# Patient Record
Sex: Male | Born: 1960 | Race: White | Hispanic: No | Smoking: Current every day smoker
Health system: Southern US, Community
[De-identification: ages and names within clinical notes are randomized; demographics above are authoritative.]

## PROBLEM LIST (undated history)

## (undated) DIAGNOSIS — I10 Essential (primary) hypertension: Secondary | ICD-10-CM

## (undated) DIAGNOSIS — F32A Depression, unspecified: Secondary | ICD-10-CM

## (undated) DIAGNOSIS — F329 Major depressive disorder, single episode, unspecified: Secondary | ICD-10-CM

## (undated) HISTORY — PX: APPENDECTOMY: SHX54

## (undated) HISTORY — PX: PATELLA FRACTURE SURGERY: SHX735

## (undated) HISTORY — PX: BACK SURGERY: SHX140

---

## 1997-10-31 ENCOUNTER — Ambulatory Visit (HOSPITAL_COMMUNITY): Admission: RE | Admit: 1997-10-31 | Discharge: 1997-11-01 | Payer: Self-pay | Admitting: Neurosurgery

## 2001-03-25 ENCOUNTER — Inpatient Hospital Stay (HOSPITAL_COMMUNITY): Admission: RE | Admit: 2001-03-25 | Discharge: 2001-03-27 | Payer: Self-pay | Admitting: Family Medicine

## 2001-03-25 ENCOUNTER — Encounter: Payer: Self-pay | Admitting: Family Medicine

## 2003-02-22 ENCOUNTER — Encounter: Payer: Self-pay | Admitting: Family Medicine

## 2003-02-22 ENCOUNTER — Ambulatory Visit (HOSPITAL_COMMUNITY): Admission: RE | Admit: 2003-02-22 | Discharge: 2003-02-22 | Payer: Self-pay | Admitting: Family Medicine

## 2004-09-26 ENCOUNTER — Ambulatory Visit (HOSPITAL_COMMUNITY): Admission: RE | Admit: 2004-09-26 | Discharge: 2004-09-26 | Payer: Self-pay | Admitting: General Surgery

## 2006-05-19 ENCOUNTER — Inpatient Hospital Stay (HOSPITAL_COMMUNITY): Admission: EM | Admit: 2006-05-19 | Discharge: 2006-05-23 | Payer: Self-pay | Admitting: Emergency Medicine

## 2006-07-27 ENCOUNTER — Ambulatory Visit (HOSPITAL_COMMUNITY): Admission: RE | Admit: 2006-07-27 | Discharge: 2006-07-27 | Payer: Self-pay | Admitting: Orthopaedic Surgery

## 2006-11-19 ENCOUNTER — Emergency Department: Payer: Self-pay | Admitting: Emergency Medicine

## 2007-02-15 ENCOUNTER — Ambulatory Visit (HOSPITAL_COMMUNITY): Admission: RE | Admit: 2007-02-15 | Discharge: 2007-02-15 | Payer: Self-pay | Admitting: Orthopaedic Surgery

## 2009-08-26 ENCOUNTER — Emergency Department (HOSPITAL_COMMUNITY): Admission: EM | Admit: 2009-08-26 | Discharge: 2009-08-26 | Payer: Self-pay | Admitting: Emergency Medicine

## 2009-08-29 ENCOUNTER — Ambulatory Visit (HOSPITAL_COMMUNITY): Admission: RE | Admit: 2009-08-29 | Discharge: 2009-08-29 | Payer: Self-pay | Admitting: Family Medicine

## 2010-07-29 LAB — BUN: BUN: 6 mg/dL (ref 6–23)

## 2010-07-29 LAB — CREATININE, SERUM: GFR calc non Af Amer: >60 mL/min (ref >60)

## 2010-09-23 NOTE — Op Note (Signed)
NAMEPURL, CLAYTOR               ACCOUNT NO.:  [id number]   MEDICAL RECORD NO.:  [id number]          PATIENT TYPE:  AMB   LOCATION:  DAY                           FACILITY:  [facility]   PHYSICIAN:  J. Darreld Mclean, M.D. DATE OF BIRTH:  30-Jul-1960   DATE OF PROCEDURE:  DATE OF DISCHARGE:                               OPERATIVE REPORT   PREOPERATIVE DIAGNOSIS:  Status post fracture, right patella.  Now for  hardware removal.   POSTOPERATIVE DIAGNOSIS:  Status post fracture, right patella.  Now for  hardware removal.   PROCEDURE:  Removal of two Kirschner wires and a figure-of-eight wire  from the right knee, status post patellar fracture.   ANESTHESIA:  General.   TOURNIQUET TIME:  29 minutes.   DRAINS:  None.   SURGEON:  J. Darreld Mclean, M.D.   The wires were given to the patient.   Patient had a fractured right patella in January of this year.  The  fracture is now healed.  One of the wires is irritating his knee.  He  would like to have it removed.  The wires are not broken.  Risks and  imponderables of the procedure have been discussed with the patient.  He  appears to understand and agree with the procedure.  In the previous  surgery, he understands about having degenerative disease around the  knee and that still holds.   DESCRIPTION OF PROCEDURE:  Patient was seen in the holding area.  The  right knee was marked by the patient.  I marked the right knee as the  correct surgical site.  He was brought to the operating room.  He was  placed supine on the operating room table and given general anesthesia.  The tourniquet was placed and deflated at the right upper thigh.  He was  prepped and draped in the usual manner.  We had a time out and  identified Mr. Deisher as the patient and identified the right knee as the  correct surgical site.  The leg was elevated and wrapped  circumferentially with an esmarch bandage.  The tourniquet inflated to  300 mmHg.  The esmarch bandage  removed.  A small incision was made  distally at the distal portion of the wound and with general dissection  and palpation, I found the wires distally.  The Kirschner wires were  identified and turned.  The figure-of-eight wire was found, and the ends  of that were cut to expose the wire.  The most lateral wire was  identified first, then I went proximally and made a stab wound there  over the previous incision.  I found the proximal portion of the  Kirschner wire the most lateral.  The inferior portion of the wire was  cut, then was brought out proximally.  Over the most lateral wire, it  was easier to cut it proximally and then pull it out distally.  A figure-  of-eight wire was then removed.   The wounds are irrigated.  The wounds are approximated using 2-0  chromic, 2-0 plain, and then skin staples.  The tourniquet  was deflated  for 29 minutes.  Sterile dressing applied.  Bulky dressing applied.  Patient was given a prescription for Vicodin ES for pain.  I will see  him in the office in a week to 10 days.  For any difficulties, contact  me through the office hospital beeper system, numbers have been  provided.           ______________________________  Shela Commons. Darreld Mclean, M.D.     JWK/MEDQ  D:  02/15/2007  T:  02/15/2007  Job:  [id number]

## 2010-09-23 NOTE — H&P (Signed)
Ricky Casey, Ricky Casey               ACCOUNT NO.:  [id number]   MEDICAL RECORD NO.:  [id number]           PATIENT TYPE:  AMB   LOCATION:  DAY                           FACILITY:  [facility]   PHYSICIAN:  J. Darreld Mclean, M.D. DATE OF BIRTH:  [birth year]   DATE OF ADMISSION:  DATE OF DISCHARGE:  LH                              HISTORY & PHYSICAL   CHIEF COMPLAINT:  I need those wires removed from my knee cap.   HISTORY:  The patient is a 50 year old male who fractured his right  patella in January earlier this year.  The fracture is now healed, but  one of the wires is irritating his knee.  He would like to go ahead and  have this removed now.  He has done well with his knee and has had good  motion.  Since I saw him he has had problems with his back, and has been  treated for back problems.  He is seeing Dr. Newell Coral concerning his  back.  He is going to physical therapy related to his back as well.   CURRENT MEDICATIONS:  He takes occasional Flexeril, occasional Advil.   ALLERGIES:  He has no allergies.   HISTORY:  1. History of hypertension.  2. He had back surgery and 1998.  3. Appendectomy in 2003.  4. My knee surgery in January of this year.   FAMILY HISTORY:  He smokes.  He does not use alcoholic beverages.  DR.  Regino Schultze is his family doctor.  Heart disease and diabetes runs in the  family.  The patient is divorced, lives in [place].  The patient's  vital signs are normal.  HEENT: Negative.  NECK:  Supple  LUNGS:  Clear to P&A.  HEART:  Regular rhythm without murmur heard.  ABDOMEN:  Soft and tender without masses.  EXTREMITIES:  A well-healed scar over the right knee, and there is  prominence of one of the Kirschner wires.  Range of motion is very good  slight crepitus.  Other extremities are negative.  CENTRAL NERVOUS SYSTEM:  Intact.  SKIN:  Intact.   IMPRESSION:  1. Status post fracture right patella with prominence of one of the      pins.  2. History of low back  pain.   PLAN:  Removal of orthopedic hardware right knee.  I discussed with the  patient the planned procedure risks, and imponderables.  She understands  and agrees to this procedure as outlined.                                            ______________________________  J. Darreld Mclean, M.D.     JWK/MEDQ  D:  02/14/2007  T:  02/14/2007  Job:  [id number]

## 2010-09-26 NOTE — Discharge Summary (Signed)
[facility]  Patient:    Ricky Casey, Ricky Casey Visit Number: [id number] MRN: [id number]          Service Type: MED Location: 3A A322 01 Attending Physician:  Barbaraann Barthel Dictated by:   Barbaraann Barthel, M.D. Admit Date:  03/25/2001 Discharge Date: 03/27/2001   CC:         Karleen Hampshire, M.D.   Discharge Summary  DIAGNOSES:  Acute appendicitis.  PROCEDURE:  On March 25, 2001 exploratory laparotomy with appendectomy.  HISTORY:  This is a 50 year old white male who came into the emergency room with signs and symptoms clinically suggestive of acute appendicitis.  He had a low grade temperature.  He had a normal white count; however, he had right lower quadrant pain with localized rebound and anorexia.  He had a CT scan which suggested acute appendicitis noted in the emergency room as well.  He was taken to surgery on March 25, 2001 after initiation of hydration and antibiotic therapy and he was confirmed to have an acute suppurative nonperforated appendicitis.  Postoperatively his course was unremarkable.  His wound remained clean.  He was voiding without dysuria.  He had no leg pain or shortness of breath and his diet and activity were advanced as tolerated.  He was discharged on the second postoperative day.  LABORATORIES:  Pathology revealed acute appendicitis.  The patient was admitted with an 8.8 white count with an H&H of 16.1 and 45.8 with a normal shift.  He was discharged with a white count 10.2, H&H 14.0 and 39.5.  His electrolytes remained within normal limits.  As stated, his CT scan revealed changes suggestive of acute appendicitis. Electrocardiogram was grossly within normal limits.  As the patients hospital course was completely unremarkable, he was discharged on the second postoperative day.  We will follow him perioperatively after which he is to return to Dr. Regino Schultze for any medical problems he may have in the  future.  DISCHARGE INSTRUCTIONS:  Being excused from work.  He is discharged on a full liquid diet.  He is permitted to shower.  He is permitted to walk up and down the stairs. He is told to refrain from doing any heavy lifting or long driving or sexual activity in the immediate postoperative period.  He is discharged on Cipro 500 mg one tablet p.o. b.i.d.  He is told to refrain from any aspirin products and told to clean his wound with alcohol t.i.d.  He is to take Darvocet-N 100 one tablet q.4h. p.r.n. for pain.  He is told to refrain from using Ex-Lax or any other harsh ______.  We will follow him perioperatively after which he is to return to Dr. Regino Schultze for any medical problems in the future. Dictated by:   Barbaraann Barthel, M.D. Attending Physician:  Barbaraann Barthel DD:  04/13/01 TD:  04/13/01 Job: [id number]
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
## 2010-09-26 NOTE — Consult Note (Signed)
[facility]  Patient:    Ricky Casey, Ricky Casey Visit Number: [id number] MRN: [id number]          Service Type: EMS Location: ED Attending Physician:  Kirk Ruths Dictated by:   Barbaraann Barthel, M.D. Proc. Date: 03/25/01 Admit Date:  03/25/2001   CC:         Karleen Hampshire, M.D.   Consultation Report  BRIEF HISTORY:  Surgery was asked to see this 50 year old white male with approximately a 36-hour history of lower abdominal pain with nausea and vomiting.  HISTORY OF PRESENT ILLNESS:  The patient states that yesterday he awoke, and he was feeling well, however, when he got work he developed some crampy abdominal pain and nausea and vomiting.  He tried to eat at work but he vomited immediately.  He came back home and tried to take some tea and he vomited this as well.  He treated himself with Pepto-Bismol and this did not help him.  He sought medical attention by Dr. Regino Schultze, who obtained a CT scan which showed findings consistent with acute appendicitis.  Surgery was consulted and responded immediately.  PHYSICAL EXAMINATION:  GENERAL:  A pleasant 50 year old white male in no acute distress but uncomfortable.  VITAL SIGNS:  He is 6 foot 2 and weighs 235 pounds.  His temperature is 99. His heart rate is 83 per minute. Respirations 20 per minute and blood pressure 121/83.  HEENT:  Normocephalic.  Eyes:  Extraocular movements are intact.  Pupils are round and react to light and accommodation.  There is no conjunctive pallor or scleral injection.  Sclerae is normal tincture.  NECK:  Supple and cylindrical without jugular venous distension, thyromegaly, or tracheal deviation.  There is no cervical adenopathy.  CHEST:  Clear both to anterior and posterior auscultation.  HEART:  Regular.  No murmurs appreciated.  ABDOMEN:  Bowel sounds are diminished.  The patient is tender in the right lower quadrant with localized rebound.  He has no  hernias.  RECTAL:  The prostate is smooth and stool is guaiac negative.  EXTREMITIES:  Within normal limits.  REVIEW OF SYSTEMS:  The patient had no other surgery other than in the year 2000 he underwent a lumbar disk surgery.  He takes no medications on a regular basis other than for his sinus problems and he is taking rhinocort and Entex for these.  He took some Pepto-Bismol and then an aspirin prior to coming to the emergency room.  ALLERGIES:  No known drug allergies.  SOCIAL HISTORY:  He has no history of alcohol abuse.  He smokes 1-1/2 packs of cigarettes per day.  GI SYSTEM:  Nausea and vomiting.  Right lower quadrant pain.  No history of diarrhea or bright red rectal bleeding or black tarry stools.  No past history of hepatitis or inflammatory bowel disease.  GU SYSTEM:  The apartment has no dysuria, no history of kidney stones and has no CV tenderness.  ENDOCRINE SYSTEM:  No history of diabetes or thyroid disease.  NEUROLOGIC SYSTEM:  Within normal limits.  MUSCULOSKELETAL:  The patient is status post lumbar disk surgery.  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  LABORATORY DATA:  The patient has a white count of 8.8 with an hemoglobulin and hematocrit of 16.1, and 45.8, with 71 neutrophils appreciated.  Metabolic 7 is grossly within normal limits.  His potassium is slightly diminished at 3.4.  CT scan read by Dr. Purcell Mouton, shows symptoms suggestive of acute  appendicitis and the appendix is retrocecal.  PLAN:  The patient will be admitted.  He will be n.p.o.  Hydration is begun. Antibiotics have been administered and we will plan for laparotomy and appendectomy.  The procedure was explained in detail with the patient including risks, not limited to but including bleeding, infection, and leaking.  Informed consent was obtained. Dictated by:   Barbaraann Barthel, M.D. Attending Physician:  Kirk Ruths DD:  03/25/01 TD:  03/25/01 Job:  [id number]
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
## 2010-09-26 NOTE — H&P (Signed)
Ricky Casey, Ricky Casey               ACCOUNT NO.:  [id number]   MEDICAL RECORD NO.:  [id number]          PATIENT TYPE:  AMB   LOCATION:                                FACILITY:  [facility]   PHYSICIAN:  Dalia Heading, M.D.  DATE OF BIRTH:  Feb 05, 1961   DATE OF ADMISSION:  09/26/2004  DATE OF DISCHARGE:  LH                                HISTORY & PHYSICAL   CHIEF COMPLAINT:  Pilonidal cyst, lower abdominal pain and constipation.   HISTORY OF PRESENT ILLNESS:  Patient is a 50 year old white male who is  referred for evaluation and treatment of recurrent infected pilonidal cysts  as well as intermittent lower abdominal pain with constipation.  He has had  incision and drainage of the cyst in the remote past.  It has recurred  several times since then.  He is currently finishing a course of  antibiotics.  No purulent drainage is noted.  He has had intermittent  abdominal cramping and constipation for many years.  No nausea or vomiting  have been noted.  There is no family history of colon carcinoma.  No melena  or hematochezia have been noted.  He has never had a colonoscopy.  MiraLax  has been only minimally helpful.  He has a bowel movement every 4 days.   PAST MEDICAL HISTORY:  As noted above.   PAST SURGICAL HISTORY:  Appendectomy, lower back surgery as described.   CURRENT MEDICATIONS:  Keflex 500 mg p.o. t.i.d.   ALLERGIES:  No known drug allergies.   REVIEW OF SYSTEMS:  Patient does smoke a pack of cigarettes a day.  Denies  any significant alcohol use.  Denies any other cardiopulmonary difficulties  or bleeding disorders.   PHYSICAL EXAMINATION:  On physical examination, patient is a well-developed,  well-nourished white male in no acute distress.  He is afebrile and vital  signs are stable.  Lungs clear to auscultation with equal breath sounds  bilaterally.  Heart examination reveals a regular rate and rhythm without  S3, S4, or murmurs.  The abdomen is soft, nontender,  nondistended.  No  hepatosplenomegaly or masses are noted.  Back examination reveals a  pilonidal cyst present with mild induration but no drainage.  Minimal  erythema is noted.  Rectal examination was deferred to the procedure.   IMPRESSION:  1.  Pilonidal cyst, recurrent.  2.  Lower abdominal pain and constipation.   PLAN:  The patient is scheduled for TCS and excision of a pilonidal cyst on  Sep 26, 2004.  The risks and benefits of the procedures including bleeding,  infection, perforation, and wound dehiscence were fully explained to the  patient, who gave informed consent.      MAJ/MEDQ  D:  09/11/2004  T:  09/11/2004  Job:  [id number]   cc:   [facility]  Fax: [phone]   Kirk Ruths, M.D.  [address]  [place]  [place] [address]  Fax: [phone]
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
## 2010-09-26 NOTE — Op Note (Signed)
Ricky Casey, Ricky Casey               ACCOUNT NO.:  [id number]   MEDICAL RECORD NO.:  [id number]          PATIENT TYPE:  AMB   LOCATION:  DAY                           FACILITY:  [facility]   PHYSICIAN:  Dalia Heading, M.D.  DATE OF BIRTH:  02/27/1961   DATE OF PROCEDURE:  09/26/2004  DATE OF DISCHARGE:                                 OPERATIVE REPORT   PREOPERATIVE DIAGNOSIS:  Hematochezia, recurrent pilonidal cyst.   POSTOPERATIVE DIAGNOSIS:  Hematochezia, recurrent pilonidal cyst.   PROCEDURE:  Excision of pilonidal cyst, colonoscopy.   SURGEON:  Dr. Franky Macho.   ANESTHESIA:  1.  General for the pilonidal cyst  2.  MAC for the colonoscopy.   INDICATIONS:  The patient is a 50 year old white male who presents with both  hematochezia and the recurrent infected pilonidal cyst. The risks and  benefits of both the procedures including bleeding, perforation, and  recurrence of the pilonidal cyst were fully explained to the patient, who  gave informed consent.   PROCEDURE NOTE:  The patient was placed left lateral decubitus position.  Confirmation of the procedure was done. A rectal examination was performed  which was negative for any mass lesions. The colonoscope was advanced to the  cecum without difficulty. Confirmation of placement to the cecum was done  using transabdominal palpation and landmarks. The bowel preparation was  adequate, but not good. The cecum was fully visualized and noted to within  normal limits. The ascending colon, transverse colon, descending colon,  sigmoid colon, and rectum were all within normal limits. No abnormal lesions  were noted. Small internal hemorrhoids were noted, but they were not  actively bleeding. All fluid and air were then evacuated from the colon and  rectum prior to removal of the colonoscope.   Next, the patient underwent excision of pilonidal cyst after general  anesthesia was administered. The coccyx area was prepped and draped  using  the usual sterile technique with Betadine. Surgical site confirmation was  performed.   Elliptical incision was made in the skin over the coccyx. This was taken  down to the coccyx. The pilonidal cyst was excised and sent to pathology for  further examination. Bleeding was controlled using Bovie electrocautery. The  subcutaneous layer was reapproximated using a 3-0 Vicryl interrupted suture.  The skin was closed using a 4-0 nylon suture. Sensorcaine 0.5% was instilled  into the surrounding wound. Betadine ointment and dry sterile dressing were  applied.   All tape and needle counts were correct at the end of the procedure. The  patient was awakened and transferred to PACU in stable condition.   COMPLICATIONS:  None.   SPECIMEN:  Pilonidal cyst.   BLOOD LOSS:  Minimal.      MAJ/MEDQ  D:  09/26/2004  T:  09/26/2004  Job:  [id number]   cc:   Kirk Ruths, M.D.  [address]  [place]  [place] [address]  Fax: [phone]

## 2010-09-26 NOTE — Op Note (Signed)
[facility]  Patient:    Ricky Casey, Ricky Casey Visit Number: [id number] MRN: [id number]          Service Type: MED Location: 3A A322 01 Attending Physician:  Rosalyn Charters Dictated by:   Barbaraann Barthel, M.D. Proc. Date: 03/25/01 Admit Date:  03/25/2001   CC:         Karleen Hampshire, M.D.   Operative Report  PREOPERATIVE DIAGNOSIS:  Acute appendicitis.  POSTOPERATIVE DIAGNOSIS:  Acute appendicitis.  OPERATION:  Exploratory laparotomy with appendectomy.  SURGEON:  Barbaraann Barthel, M.D.  NOTE:  This is a 50 year old white male who came into the emergency room with low-grade temperature, normal white count, and other signs and symptoms suggestive of acute appendicitis.  He also had a CT scan which suggested appendicitis.  He was taken to surgery after discussing the procedure with him in detail including the risks not limited to but including bleeding, infection, and leaking.  Informed consent was obtained.  GROSS OPERATIVE FINDINGS:  Those consistent with acute suppurative, nonperforated appendicitis.  The patient had a retrocecal appendix with the tip inflamed about the size of a thumb.  There was no perforation.  The rest of the right lower quadrant appeared to be normal.  There was normal terminal ileum and no other abnormalities.  TECHNIQUE:  The patient was placed in a supine position.  After the adequate administration of general anesthesia via endotracheal intubation, the entire abdomen was prepped with Betadine solution and draped in the usual manner. Foley catheter was aseptically inserted.  A small transverse incision was carried about the level of the iliac spine through skin, subcutaneous tissue, down to the fascia which was opened.  The rectus muscle was retracted medially and divided in part.  The peritoneum was grasped between two clamps, and the abdomen was then opened and explored with the above findings.  The appendix was then  delivered into the wound, and we ligated the mesoappendix with 2-0 Polysorb.  The appendix was then removed with the Endo GIA stapling device. After checking for hemostasis, the area was irrigated with normal saline solution.  There was a problem with the sponge count, and this delayed as we had placed a sponge in the abdomen which took [place] time to retrieve.  However, at the time of closure, all sponge, needle, and instrument counts were accounted for.  The perineum was closed with a running 0 Polysorb suture, and the fascia was closed with figure-of-eight 0 Polysorb sutures.  Subcutaneous tissue was irrigated with normal saline solution, and the skin was approximated with a stapling device.  Prior to closure, all sponge, needle, and instrument counts were found to be correct.  Estimated blood loss was minimal, approximately 50 cc.  No drains were placed.  The patient received 2000 cc of crystalloid intraoperatively, and there were no complications. Dictated by:   Barbaraann Barthel, M.D. Attending Physician:  Rosalyn Charters DD:  03/25/01 TD:  03/25/01 Job: [id number]
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
## 2011-02-19 LAB — COMPREHENSIVE METABOLIC PANEL
Alkaline Phosphatase: 58
BUN: 12
CO2: 26
Creatinine, Ser: 1.01
GFR calc non Af Amer: >60

## 2011-02-19 LAB — DIFFERENTIAL
Basophils Relative: 1
Eosinophils Absolute: 0.4
Lymphocytes Relative: 21
Neutro Abs: 5.9
Neutrophils Relative %: 69

## 2011-02-19 LAB — URINALYSIS, ROUTINE W REFLEX MICROSCOPIC
Bilirubin Urine: NEGATIVE
Glucose, UA: NEGATIVE
Hgb urine dipstick: NEGATIVE
Ketones, ur: NEGATIVE
Nitrite: NEGATIVE
Protein, ur: NEGATIVE
Urobilinogen, UA: 0.2
pH: 6

## 2011-02-19 LAB — POCT I-STAT 4, (NA,K, GLUC, HGB,HCT): Glucose, Bld: 94

## 2011-02-19 LAB — CBC
HCT: 41.5
Hemoglobin: 14.2

## 2015-05-02 ENCOUNTER — Emergency Department (HOSPITAL_COMMUNITY)
Admission: EM | Admit: 2015-05-02 | Discharge: 2015-05-02 | Disposition: A | Discharge status: Home / Self Care | Payer: Self-pay | Attending: Emergency Medicine | Admitting: Emergency Medicine

## 2015-05-02 ENCOUNTER — Emergency Department (HOSPITAL_COMMUNITY): Payer: Self-pay

## 2015-05-02 ENCOUNTER — Encounter (HOSPITAL_COMMUNITY): Payer: Self-pay | Admitting: Emergency Medicine

## 2015-05-02 DIAGNOSIS — F329 Major depressive disorder, single episode, unspecified: Secondary | ICD-10-CM | POA: Insufficient documentation

## 2015-05-02 DIAGNOSIS — Z79899 Other long term (current) drug therapy: Secondary | ICD-10-CM | POA: Insufficient documentation

## 2015-05-02 DIAGNOSIS — M62830 Muscle spasm of back: Secondary | ICD-10-CM | POA: Insufficient documentation

## 2015-05-02 DIAGNOSIS — F17210 Nicotine dependence, cigarettes, uncomplicated: Secondary | ICD-10-CM | POA: Insufficient documentation

## 2015-05-02 DIAGNOSIS — M545 Low back pain: Secondary | ICD-10-CM | POA: Insufficient documentation

## 2015-05-02 DIAGNOSIS — I10 Essential (primary) hypertension: Secondary | ICD-10-CM | POA: Insufficient documentation

## 2015-05-02 DIAGNOSIS — M549 Dorsalgia, unspecified: Secondary | ICD-10-CM

## 2015-05-02 HISTORY — DX: Major depressive disorder, single episode, unspecified: F32.9

## 2015-05-02 HISTORY — DX: Essential (primary) hypertension: I10

## 2015-05-02 HISTORY — DX: Depression, unspecified: F32.A

## 2015-05-02 LAB — URINALYSIS, ROUTINE W REFLEX MICROSCOPIC
BILIRUBIN URINE: NEGATIVE
GLUCOSE, UA: NEGATIVE mg/dL
Hgb urine dipstick: NEGATIVE
KETONES UR: NEGATIVE mg/dL
Leukocytes, UA: NEGATIVE
NITRITE: NEGATIVE
PH: 6.5 (ref 5.0–8.0)
Protein, ur: NEGATIVE mg/dL
SPECIFIC GRAVITY, URINE: 1.01 (ref 1.005–1.030)

## 2015-05-02 MED ORDER — DIAZEPAM 5 MG PO TABS
5.0000 mg | ORAL_TABLET | Freq: Once | ORAL | Status: AC
  Administered 2015-05-02: 5 mg via ORAL
  Filled 2015-05-02: qty 1

## 2015-05-02 MED ORDER — KETOROLAC TROMETHAMINE 60 MG/2ML IM SOLN
60.0000 mg | Freq: Once | INTRAMUSCULAR | Status: AC
Start: 2015-05-02 — End: 2015-05-02
  Administered 2015-05-02: 60 mg via INTRAMUSCULAR
  Filled 2015-05-02: qty 2

## 2015-05-02 MED ORDER — HYDROMORPHONE HCL 1 MG/ML IJ SOLN
1.0000 mg | Freq: Once | INTRAMUSCULAR | Status: AC
  Administered 2015-05-02: 1 mg via INTRAVENOUS
  Filled 2015-05-02: qty 1

## 2015-05-02 MED ORDER — HYDROMORPHONE HCL 1 MG/ML IJ SOLN
1.0000 mg | Freq: Once | INTRAMUSCULAR | Status: AC
  Administered 2015-05-02: 1 mg via INTRAMUSCULAR
  Filled 2015-05-02: qty 1

## 2015-05-02 MED ORDER — IBUPROFEN 800 MG PO TABS: 800.0000 mg | ORAL_TABLET | Freq: Three times a day (TID) | ORAL | Status: DC

## 2015-05-02 MED ORDER — DIAZEPAM 5 MG PO TABS: 5.0000 mg | ORAL_TABLET | Freq: Two times a day (BID) | ORAL | Status: DC | PRN

## 2015-05-02 NOTE — ED Notes (Signed)
Patient able to ambulate with assistance x 1.  

## 2015-05-02 NOTE — ED Notes (Addendum)
Pt c/o lower back pain since 1100 am. Pt brought in by EMS on scoop stretcher. Pt states he bent over to pick up something and had spasm to lower back. Pt laid himself in the floor of his residence. Denies fall. denies numbness/tingling. Nad noted. Pt states he took 15mg  cyclobenzaprine po and one and a half tablet of hydrocodone 10/325 po at home pta.

## 2015-05-02 NOTE — ED Notes (Signed)
Patient attempted to ambulate but was unable due to back spasms

## 2015-05-02 NOTE — Discharge Instructions (Signed)
Muscle Cramps and Spasms Follow up with your doctor. Return to the ED if you develop new or worsening symptoms. Muscle cramps and spasms occur when a muscle or muscles tighten and you have no control over this tightening (involuntary muscle contraction). They are a common problem and can develop in any muscle. The most common place is in the calf muscles of the leg. Both muscle cramps and muscle spasms are involuntary muscle contractions, but they also have differences:   Muscle cramps are sporadic and painful. They may last a few seconds to a quarter of an hour. Muscle cramps are often more forceful and last longer than muscle spasms.  Muscle spasms may or may not be painful. They may also last just a few seconds or much longer. CAUSES  It is uncommon for cramps or spasms to be due to a serious underlying problem. In many cases, the cause of cramps or spasms is unknown. Some common causes are:   Overexertion.   Overuse from repetitive motions (doing the same thing over and over).   Remaining in a certain position for a long period of time.   Improper preparation, form, or technique while performing a sport or activity.   Dehydration.   Injury.   Side effects of some medicines.   Abnormally low levels of the salts and ions in your blood (electrolytes), especially potassium and calcium. This could happen if you are taking water pills (diuretics) or you are pregnant.  Some underlying medical problems can make it more likely to develop cramps or spasms. These include, but are not limited to:   Diabetes.   Parkinson disease.   Hormone disorders, such as thyroid problems.   Alcohol abuse.   Diseases specific to muscles, joints, and bones.   Blood vessel disease where not enough blood is getting to the muscles.  HOME CARE INSTRUCTIONS   Stay well hydrated. Drink enough water and fluids to keep your urine clear or pale yellow.  It may be helpful to massage, stretch,  and relax the affected muscle.  For tight or tense muscles, use a warm towel, heating pad, or hot shower water directed to the affected area.  If you are sore or have pain after a cramp or spasm, applying ice to the affected area may relieve discomfort.  Put ice in a plastic bag.  Place a towel between your skin and the bag.  Leave the ice on for 15-20 minutes, 03-04 times a day.  Medicines used to treat a known cause of cramps or spasms may help reduce their frequency or severity. Only take over-the-counter or prescription medicines as directed by your caregiver. SEEK MEDICAL CARE IF:  Your cramps or spasms get more severe, more frequent, or do not improve over time.  MAKE SURE YOU:   Understand these instructions.  Will watch your condition.  Will get help right away if you are not doing well or get worse.   This information is not intended to replace advice given to you by your health care provider. Make sure you discuss any questions you have with your health care provider.   Document Released: 10/17/2001 Document Revised: 08/22/2012 Document Reviewed: 04/13/2012 Elsevier Interactive Patient Education [employer]2016 [employer]vier Inc.

## 2015-05-02 NOTE — ED Notes (Signed)
Pt has hx of back pain, this is worse and causing spasms.

## 2015-05-02 NOTE — ED Provider Notes (Signed)
CSN: [id number]     Arrival date & time 05/02/15  1653 History   First MD Initiated Contact with Patient 05/02/15 1735     Chief Complaint  Patient presents with  . Back Pain     (Consider location/radiation/quality/duration/timing/severity/associated sxs/prior Treatment) HPI Comments: Patient with low back pain and spasm that onset around 11 AM. He states he bent over to pick up something and had severe spasm in his back. He laid himself on the floor for while and then was able to walk back to his house where he laid down again. Pain became worse. Denies fall. Take hydrocodone and Flexeril without relief. Patient with history of back pain in the same location but never this severe. Denies any focal weakness, numbness or tingling. No bowel or bladder incontinence. No fever or vomiting. Pain does not radiate and stays in the center of his back. No chest pain or shortness of breath. He had an MRI in August that showed multilevel disc degeneration.  The history is provided by the patient and the EMS personnel. The history is limited by the condition of the patient.    Past Medical History  Diagnosis Date  . Hypertension   . Depression    Past Surgical History  Procedure Laterality Date  . Back surgery    . Patella fracture surgery    . Appendectomy     No family history on file. Social History  Substance Use Topics  . Smoking status: Current Every Day Smoker -- 1.00 packs/day    Types: Cigarettes  . Smokeless tobacco: Not on file  . Alcohol Use: No    Review of Systems  Constitutional: Negative for fever, activity change and appetite change.  HENT: Negative for congestion and rhinorrhea.   Respiratory: Negative for cough, chest tightness and shortness of breath.   Cardiovascular: Negative for chest pain.  Gastrointestinal: Negative for nausea, vomiting and abdominal pain.  Genitourinary: Negative for dysuria and hematuria.  Musculoskeletal: Positive for back pain. Negative for  neck pain.  Skin: Negative for rash.  Neurological: Negative for dizziness, weakness and headaches.  A complete 10 system review of systems was obtained and all systems are negative except as noted in the HPI and PMH.      Allergies  Morphine and related  Home Medications   Prior to Admission medications   Medication Sig Start Date End Date Taking? Authorizing Provider  atorvastatin (LIPITOR) 40 MG tablet Take 40 mg by mouth at bedtime.   Yes Historical Provider, MD  buPROPion (WELLBUTRIN SR) 100 MG 12 hr tablet Take 100 mg by mouth daily.   Yes Historical Provider, MD  cyclobenzaprine (FLEXERIL) 10 MG tablet Take 10 mg by mouth at bedtime as needed for muscle spasms.   Yes Historical Provider, MD  HYDROcodone-acetaminophen (NORCO) 10-325 MG tablet Take 1 tablet by mouth every 12 (twelve) hours as needed for moderate pain.   Yes Historical Provider, MD  lisinopril (PRINIVIL,ZESTRIL) 10 MG tablet Take 10 mg by mouth daily.   Yes Historical Provider, MD  sertraline (ZOLOFT) 100 MG tablet Take 100 mg by mouth at bedtime.   Yes Historical Provider, MD  diazepam (VALIUM) 5 MG tablet Take 1 tablet (5 mg total) by mouth every 12 (twelve) hours as needed for muscle spasms. 05/02/15   Glynn Octave, MD  ibuprofen (ADVIL,MOTRIN) 800 MG tablet Take 1 tablet (800 mg total) by mouth 3 (three) times daily. 05/02/15   Glynn Octave, MD   BP 132/93 mmHg  Pulse 88  Temp(Src) 98.3 F (36.8 C)  Resp 18  Ht 6\' 2"  (1.88 m)  Wt 240 lb (108.863 kg)  BMI 30.80 kg/m2  SpO2 94% Physical Exam  Constitutional: He is oriented to person, place, and time. He appears well-developed and well-nourished. He appears distressed.  uncomfortable  HENT:  Head: Normocephalic and atraumatic.  Mouth/Throat: Oropharynx is clear and moist. No oropharyngeal exudate.  Eyes: Conjunctivae and EOM are normal. Pupils are equal, round, and reactive to light.  Neck: Normal range of motion. Neck supple.  No meningismus.   Cardiovascular: Normal rate, regular rhythm, normal heart sounds and intact distal pulses.   No murmur heard. Pulmonary/Chest: Effort normal and breath sounds normal. No respiratory distress. He exhibits no tenderness.  Abdominal: Soft. There is no tenderness. There is no rebound and no guarding.  Musculoskeletal: Normal range of motion. He exhibits tenderness. He exhibits no edema.  Tenderness to palpation in the midline lumbar spine. No step-off.  5/5 strength in bilateral lower extremities. Ankle plantar and dorsiflexion intact. Great toe extension intact bilaterally. +2 DP and PT pulses. +2 patellar reflexes bilaterally.    Neurological: He is alert and oriented to person, place, and time. No cranial nerve deficit. He exhibits normal muscle tone. Coordination normal.  No ataxia on finger to nose bilaterally. No pronator drift. 5/5 strength throughout. CN 2-12 intact.Equal grip strength. Sensation intact.   Skin: Skin is warm.  Psychiatric: He has a normal mood and affect. His behavior is normal.  Nursing note and vitals reviewed.   ED Course  Procedures (including critical care time) Labs Review Labs Reviewed  URINALYSIS, ROUTINE W REFLEX MICROSCOPIC (NOT AT [facility])    Imaging Review Ct Renal Stone Study  05/02/2015  CLINICAL DATA:  54 year old male with lower back pain. Concern for kidney stone. EXAM: CT ABDOMEN AND PELVIS WITHOUT CONTRAST TECHNIQUE: Multidetector CT imaging of the abdomen and pelvis was performed following the standard protocol without IV contrast. COMPARISON:  CT dated 11/19/2006 and lumbar MRI dated 04/12/2012 FINDINGS: Evaluation of this exam is limited in the absence of intravenous contrast. The visualized lung bases are clear. No intra-abdominal free air or free fluid. The liver, gallbladder, pancreas, spleen, adrenal glands, kidneys, visualized ureters, and urinary bladder appear unremarkable. The prostate and seminal vesicles are grossly unremarkable. There is  moderate stool throughout the colon. There are scattered colonic diverticula. No evidence of bowel obstruction or inflammation. Appendectomy. The abdominal aorta and IVC appear unremarkable on this noncontrast study. No portal venous gas identified. There is no adenopathy. There is a small fat containing umbilical hernia the abdominal wall soft tissues otherwise appear unremarkable. Old right posterior rib fracture deformity noted. There is no acute fracture. IMPRESSION: No hydronephrosis or nephrolithiasis. No evidence of bowel obstruction or inflammation.  Normal appendix. Electronically Signed   By: Elgie CollardArash  Radparvar M.D.   On: 05/02/2015 19:04   I have personally reviewed and evaluated these images and lab results as part of my medical decision-making.   EKG Interpretation None      MDM   Final diagnoses:  Back pain  Back spasm   lumbar back pain and spasm. No radiation of pain. No incontinence. Intact strength and sensation on exam. Intact patellar reflexes.  Recent MRI in August 2016 reviewed. IMPRESSION: Multilevel degenerative changes most pronounced at the L4-L5 vertebral level for which there is moderate spinal canal stenosis with moderate bilateral neural foraminal narrowing.  Urinalysis negative.  Patient given pain medicine and muscle relaxers in the ED. His pain  is improved. There is no focal weakness, numbness or tingling or incontinence. No evidence of cord compression or cauda equina.  Patient is ambulatory. He takes hydrocodone chronically. We'll add some muscle relaxer and antiinflammatory to his regimen. Follow-up with PCP this week. Return precautions discussed.  Glynn Octave, MD 05/03/15 0157

## 2015-05-02 NOTE — ED Notes (Signed)
Ambulated patient from bed to outside of room and back to bed with no difficulty. Patient stated he was still in pain. Made nurse aware.

## 2021-09-13 ENCOUNTER — Emergency Department (HOSPITAL_COMMUNITY): Payer: Self-pay

## 2021-09-13 ENCOUNTER — Emergency Department (HOSPITAL_COMMUNITY)
Admission: EM | Admit: 2021-09-13 | Discharge: 2021-09-14 | Disposition: A | Discharge status: Home / Self Care | Payer: Self-pay | Attending: Emergency Medicine | Admitting: Emergency Medicine

## 2021-09-13 ENCOUNTER — Encounter (HOSPITAL_COMMUNITY): Payer: Self-pay

## 2021-09-13 ENCOUNTER — Other Ambulatory Visit: Payer: Self-pay

## 2021-09-13 DIAGNOSIS — W11XXXA Fall on and from ladder, initial encounter: Secondary | ICD-10-CM | POA: Insufficient documentation

## 2021-09-13 DIAGNOSIS — S270XXA Traumatic pneumothorax, initial encounter: Secondary | ICD-10-CM

## 2021-09-13 DIAGNOSIS — S2241XA Multiple fractures of ribs, right side, initial encounter for closed fracture: Secondary | ICD-10-CM

## 2021-09-13 DIAGNOSIS — S5001XA Contusion of right elbow, initial encounter: Secondary | ICD-10-CM

## 2021-09-13 DIAGNOSIS — S41111A Laceration without foreign body of right upper arm, initial encounter: Secondary | ICD-10-CM

## 2021-09-13 DIAGNOSIS — R519 Headache, unspecified: Secondary | ICD-10-CM | POA: Insufficient documentation

## 2021-09-13 DIAGNOSIS — Z23 Encounter for immunization: Secondary | ICD-10-CM | POA: Insufficient documentation

## 2021-09-13 DIAGNOSIS — S12691A Other nondisplaced fracture of seventh cervical vertebra, initial encounter for closed fracture: Secondary | ICD-10-CM

## 2021-09-13 LAB — CBC WITH DIFFERENTIAL/PLATELET
Abs Immature Granulocytes: 0.18 10*3/uL — ABNORMAL HIGH (ref 0.00–0.07)
Basophils Absolute: 0.1 10*3/uL (ref 0.0–0.1)
Basophils Relative: 1 %
Eosinophils Absolute: 0.2 10*3/uL (ref 0.0–0.5)
Eosinophils Relative: 1 %
HCT: 44.5 % (ref 39.0–52.0)
Hemoglobin: 14.6 g/dL (ref 13.0–17.0)
Immature Granulocytes: 1 %
Lymphocytes Relative: 12 %
Lymphs Abs: 1.8 10*3/uL (ref 0.7–4.0)
MCH: 32.4 pg (ref 26.0–34.0)
MCHC: 32.8 g/dL (ref 30.0–36.0)
MCV: 98.9 fL (ref 80.0–100.0)
Monocytes Absolute: 0.9 10*3/uL (ref 0.1–1.0)
Monocytes Relative: 6 %
Neutro Abs: 12.2 10*3/uL — ABNORMAL HIGH (ref 1.7–7.7)
Neutrophils Relative %: 79 %
Platelets: 263 10*3/uL (ref 150–400)
RBC: 4.5 MIL/uL (ref 4.22–5.81)
RDW: 13 % (ref 11.5–15.5)
WBC: 15.4 10*3/uL — ABNORMAL HIGH (ref 4.0–10.5)
nRBC: 0 % (ref 0.0–0.2)

## 2021-09-13 LAB — COMPREHENSIVE METABOLIC PANEL
ALT: 44 U/L (ref 0–44)
AST: 56 U/L — ABNORMAL HIGH (ref 15–41)
Albumin: 4.1 g/dL (ref 3.5–5.0)
Alkaline Phosphatase: 53 U/L (ref 38–126)
Anion gap: 7 (ref 5–15)
BUN: 16 mg/dL (ref 6–20)
CO2: 28 mmol/L (ref 22–32)
Calcium: 8.9 mg/dL (ref 8.9–10.3)
Chloride: 107 mmol/L (ref 98–111)
Creatinine, Ser: 1.11 mg/dL (ref 0.61–1.24)
GFR, Estimated: >60 mL/min (ref >60)
Glucose, Bld: 114 mg/dL — ABNORMAL HIGH (ref 70–99)
Potassium: 3.2 mmol/L — ABNORMAL LOW (ref 3.5–5.1)
Sodium: 142 mmol/L (ref 135–145)
Total Bilirubin: 0.5 mg/dL (ref 0.3–1.2)
Total Protein: 7.4 g/dL (ref 6.5–8.1)

## 2021-09-13 MED ORDER — TETANUS-DIPHTH-ACELL PERTUSSIS 5-2.5-18.5 LF-MCG/0.5 IM SUSY
0.5000 mL | PREFILLED_SYRINGE | Freq: Once | INTRAMUSCULAR | Status: AC
  Administered 2021-09-13: 0.5 mL via INTRAMUSCULAR
  Filled 2021-09-13: qty 0.5

## 2021-09-13 MED ORDER — ONDANSETRON HCL 4 MG/2ML IJ SOLN
4.0000 mg | Freq: Once | INTRAMUSCULAR | Status: AC
  Administered 2021-09-13: 4 mg via INTRAVENOUS
  Filled 2021-09-13: qty 2

## 2021-09-13 MED ORDER — IOHEXOL 350 MG/ML SOLN
75.0000 mL | Freq: Once | INTRAVENOUS | Status: AC | PRN
  Administered 2021-09-13: 75 mL via INTRAVENOUS

## 2021-09-13 MED ORDER — HYDROMORPHONE HCL 1 MG/ML IJ SOLN
1.0000 mg | Freq: Once | INTRAMUSCULAR | Status: AC
  Administered 2021-09-13: 1 mg via INTRAVENOUS
  Filled 2021-09-13: qty 1
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
## 2021-09-13 NOTE — ED Notes (Signed)
Patient transported to CT 

## 2021-09-13 NOTE — ED Provider Notes (Signed)
?[place] EMERGENCY DEPARTMENT ?Provider Note ? ? ?CSN: [id number] ?Arrival date & time: 09/13/21  1824 ? ?  ? ?History ? ?Chief Complaint  ?Patient presents with  ? Fall  ? ? ?Ricky Casey is a 61 y.o. male. ? ? ?Fall ?Associated symptoms include chest pain (Right-sided chest pain). Pertinent negatives include no abdominal pain, no headaches and no shortness of breath.  ? ?  ? ? ?Ricky Casey is a 61 y.o. male who presents to the Emergency Department complaining of right-sided rib pain and laceration to the right elbow.  States he fell approximately 10 to 12 feet off of a ladder.  Incident occurred shortly before arrival.  He states he landed on his right side.  Small area of bleeding just above his right elbow.  Bleeding has been controlled with direct pressure.  He denies head injury or LOC but his significant other reports abrasion to the top of the scalp.  Patient denies any neck pain, numbness or weakness of his extremities, he does have some tenderness of his lower back and reports pain to his right chest with movement and deep breathing.  Denies any shortness of breath, headaches, dizziness nausea or vomiting.  No abdominal pain. ? ? ?Home Medications ?Prior to Admission medications   ?Medication Sig Start Date End Date Taking? Authorizing Provider  ?atorvastatin (LIPITOR) 40 MG tablet Take 40 mg by mouth at bedtime.    [provider]  ?buPROPion (WELLBUTRIN SR) 100 MG 12 hr tablet Take 100 mg by mouth daily.    [provider]  ?cyclobenzaprine (FLEXERIL) 10 MG tablet Take 10 mg by mouth at bedtime as needed for muscle spasms.    [provider]  ?diazepam (VALIUM) 5 MG tablet Take 1 tablet (5 mg total) by mouth every 12 (twelve) hours as needed for muscle spasms. 05/02/15   Rancour, Jeannett SeniorStephen, MD  ?HYDROcodone-acetaminophen (NORCO) 10-325 MG tablet Take 1 tablet by mouth every 12 (twelve) hours as needed for moderate pain.    [provider]  ?ibuprofen  (ADVIL,MOTRIN) 800 MG tablet Take 1 tablet (800 mg total) by mouth 3 (three) times daily. 05/02/15   Rancour, Jeannett SeniorStephen, MD  ?lisinopril (PRINIVIL,ZESTRIL) 10 MG tablet Take 10 mg by mouth daily.    [provider]  ?sertraline (ZOLOFT) 100 MG tablet Take 100 mg by mouth at bedtime.    [provider]  ?   ? ?Allergies    ?Morphine and related   ? ?Review of Systems   ?Review of Systems  ?Constitutional:  Negative for diaphoresis.  ?HENT:  Negative for trouble swallowing.   ?Eyes:  Negative for visual disturbance.  ?Respiratory:  Negative for shortness of breath.   ?Cardiovascular:  Positive for chest pain (Right-sided chest pain).  ?Gastrointestinal:  Negative for abdominal pain, nausea and vomiting.  ?Musculoskeletal:  Positive for arthralgias (Right elbow pain and laceration) and back pain. Negative for neck pain.  ?Skin:  Positive for wound.  ?     Abrasion and laceration to the right elbow  ?Neurological:  Negative for dizziness, syncope, weakness, numbness and headaches.  ?Psychiatric/Behavioral:  Negative for confusion.   ?All other systems reviewed and are negative. ? ?Physical Exam ?Updated Vital Signs ?BP (!) 151/99   Pulse 69   Temp 98.1 ?F (36.7 ?C)   Resp 18   Ht 6\' 2"  (1.88 m)   Wt 108.9 kg   SpO2 92%   BMI 30.81 kg/m?  ?Physical Exam ?Vitals and nursing note reviewed.  ?  Constitutional:   ?   Comments: Patient uncomfortable appearing, alert  ?HENT:  ?   Head:  ?   Comments: Small abrasion to the top of his scalp.  No bleeding or hematoma. ?   Right Ear: Tympanic membrane and ear canal normal. No hemotympanum.  ?   Left Ear: Tympanic membrane and ear canal normal. No hemotympanum.  ?   Mouth/Throat:  ?   Mouth: Mucous membranes are moist.  ?   Pharynx: Oropharynx is clear.  ?Eyes:  ?   Extraocular Movements: Extraocular movements intact.  ?   Conjunctiva/sclera: Conjunctivae normal.  ?   Pupils: Pupils are equal, round, and reactive to light.  ?Cardiovascular:  ?   Rate and  Rhythm: Normal rate and regular rhythm.  ?   Pulses: Normal pulses.  ?Pulmonary:  ?   Effort: Pulmonary effort is normal.  ?   Breath sounds: Normal breath sounds.  ?Chest:  ?   Chest wall: Tenderness (Patient splinting right chest, he has diffuse tenderness palpation of the lateral and posterior right chest wall.) present.  ?Abdominal:  ?   General: There is no distension.  ?   Palpations: Abdomen is soft.  ?   Tenderness: There is no abdominal tenderness. There is no guarding.  ?   Comments: No abrasions or hematomas of the abdomen  ?Musculoskeletal:     ?   General: Tenderness and signs of injury present.  ?   Cervical back: Normal range of motion. Tenderness (Mild tenderness with rotation of the neck to the right.  No bony deformities or midline tenderness.) present.  ?   Comments: Some tenderness with range of motion of the right elbow.  Laceration to the posterior aspect just superior to the joint.  No edema or bony deformity.  Right wrist and shoulder nontender  ?Skin: ?   General: Skin is warm.  ?   Capillary Refill: Capillary refill takes less than 2 seconds.  ?   Comments: Several superficial lacerations along the right elbow.  1 cm laceration with active bleeding.  ?Neurological:  ?   General: No focal deficit present.  ?   Mental Status: He is alert.  ?   Sensory: No sensory deficit.  ?   Motor: No weakness.  ? ? ?ED Results / Procedures / Treatments   ?Labs ?(all labs ordered are listed, but only abnormal results are displayed) ?Labs Reviewed  ?CBC WITH DIFFERENTIAL/PLATELET - Abnormal; Notable for the following components:  ?    Result Value  ? WBC 15.4 (*)   ? Neutro Abs 12.2 (*)   ? Abs Immature Granulocytes 0.18 (*)   ? All other components within normal limits  ?COMPREHENSIVE METABOLIC PANEL - Abnormal; Notable for the following components:  ? Potassium 3.2 (*)   ? Glucose, Bld 114 (*)   ? AST 56 (*)   ? All other components within normal limits  ?URINALYSIS, ROUTINE W REFLEX MICROSCOPIC   ? ? ?EKG ?None ? ?Radiology ?DG Elbow Complete Right ? ?Result Date: 09/13/2021 ?CLINICAL DATA:  Fall injury.  Right elbow trauma. EXAM: RIGHT ELBOW - COMPLETE 3+ VIEW COMPARISON:  None Available. FINDINGS: There is no evidence of fracture, dislocation, or joint effusion. There is no evidence of arthropathy or other focal bone abnormality. There are tiny ossicles at the medial edge of the medial humeral epicondyle which may be due to chronic calcific tendinopathy or old epicondylitis. There is mild soft tissue fullness posteriorly and there may be a skin  laceration, difficult to confirm with overlying material partially obscuring the area. IMPRESSION: 1. No evidence of fracture or dislocation. 2. Mild soft tissue fullness posteriorly.  Query skin laceration. 3. Clustered tiny ossicles at the medial edge of the medial humeral epicondyle. Chronic appearance. Electronically Signed   By: Almira Bar M.D.   On: 09/13/2021 20:36  ? ?CT Head Wo Contrast ? ?Result Date: 09/13/2021 ?CLINICAL DATA:  Fell 10 feet from a ladder. EXAM: CT HEAD WITHOUT CONTRAST TECHNIQUE: Contiguous axial images were obtained from the base of the skull through the vertex without intravenous contrast. RADIATION DOSE REDUCTION: This exam was performed according to the departmental dose-optimization program which includes automated exposure control, adjustment of the mA and/or kV according to patient size and/or use of iterative reconstruction technique. COMPARISON:  11/19/2006 FINDINGS: Brain: The brain shows a normal appearance without evidence of malformation, atrophy, old or acute small or large vessel infarction, mass lesion, hemorrhage, hydrocephalus or extra-axial collection. Vascular: No hyperdense vessel. No evidence of atherosclerotic calcification. Skull: Normal.  No traumatic finding.  No focal bone lesion. Sinuses/Orbits: Fluid level in the left maxillary sinus that could be from the nose bleed or inflammatory sinus disease. Other: None  significant IMPRESSION: Normal head CT, with exception of some layering fluid in the left maxillary sinus. No regional fracture is seen Electronically Signed   By: Paulina Fusi M.D.   On: 09/13/2021 20:00  ? ?CT Angio Neck W and/or Wo

## 2021-09-13 NOTE — ED Notes (Signed)
[place] neck collar applied ?

## 2021-09-13 NOTE — ED Notes (Signed)
Rainbow sent to the lab.  

## 2021-09-14 ENCOUNTER — Emergency Department (HOSPITAL_COMMUNITY): Payer: Self-pay

## 2021-09-14 MED ORDER — KETOROLAC TROMETHAMINE 30 MG/ML IJ SOLN
30.0000 mg | Freq: Once | INTRAMUSCULAR | Status: AC
  Administered 2021-09-14: 30 mg via INTRAVENOUS
  Filled 2021-09-14: qty 1

## 2021-09-14 MED ORDER — HYDROCODONE-ACETAMINOPHEN 5-325 MG PO TABS
ORAL_TABLET | ORAL | 0 refills | Status: DC
Start: 2021-09-14 — End: 2023-08-04

## 2021-09-14 MED ORDER — ALBUTEROL SULFATE HFA 108 (90 BASE) MCG/ACT IN AERS
2.0000 | INHALATION_SPRAY | Freq: Once | RESPIRATORY_TRACT | Status: AC
  Administered 2021-09-14: 2 via RESPIRATORY_TRACT
  Filled 2021-09-14: qty 6.7

## 2021-09-14 MED ORDER — POVIDONE-IODINE 10 % EX SOLN
CUTANEOUS | Status: DC | PRN
Start: 2021-09-14 — End: 2021-09-14

## 2021-09-14 MED ORDER — LIDOCAINE HCL (PF) 2 % IJ SOLN
INTRAMUSCULAR | Status: AC
Start: 2021-09-14 — End: 2021-09-14
  Filled 2021-09-14: qty 20

## 2021-09-14 MED ORDER — HYDROMORPHONE HCL 1 MG/ML IJ SOLN
1.0000 mg | Freq: Once | INTRAMUSCULAR | Status: AC
  Administered 2021-09-14: 1 mg via INTRAVENOUS
  Filled 2021-09-14: qty 1

## 2021-09-14 MED ORDER — LIDOCAINE HCL (PF) 2 % IJ SOLN
2.0000 mL | Freq: Once | INTRAMUSCULAR | Status: AC
  Administered 2021-09-14: 2 mL via INTRADERMAL

## 2021-09-14 MED ORDER — CEPHALEXIN 500 MG PO CAPS: 500.0000 mg | ORAL_CAPSULE | Freq: Four times a day (QID) | ORAL | 0 refills | Status: DC

## 2021-09-14 NOTE — ED Notes (Signed)
Patient transported to repeat X-ray ?

## 2021-09-14 NOTE — ED Notes (Signed)
This RN present during Dr. Winnifred Friar conversation with pt and his SO about risks of leaving, as he is high risk for pneumonia with his rib fx's and cigarette use. Informed on importance of smoking cessation, and provided education on incentive spirometry. Pt aware of risks, still wants to discharge home. Sats have been maintained at >93% on RA. Education provided for follow-ups. ?

## 2021-09-14 NOTE — ED Notes (Signed)
Incentive spirometry education and practice provided at bedside ?

## 2021-09-14 NOTE — ED Notes (Signed)
Patient verbalizes understanding of discharge instructions. Opportunity for questioning and answers were provided. Armband removed by staff, pt discharged from ED. Wheeled out to lobby  

## 2021-09-14 NOTE — Discharge Instructions (Addendum)
You were seen today after a fall.  You have multiple injuries including a cervical spine fracture, rib fractures, small pneumothorax.  Given your smoking history, you are at high risk for complications from rib fractures.  It is important that you use the incentive spirometer and inhaler provided.  Make sure that you are taking your pain medication as prescribed.  If you have worsening shortness of breath, uncontrolled pain, any new or worsening symptoms, you should be reevaluated. ?

## 2021-09-14 NOTE — ED Provider Notes (Signed)
Patient signed out pending repeat chest x-ray.  In brief, patient fell off a ladder.  Found to have a C7 fracture, multiple rib fractures, tiny pneumothorax on CT.  Discussions with neurosurgery and trauma surgery by PA and documented in prior note.  Patient to follow-up as an outpatient with neurosurgery.  They recommend hard collar.  Per trauma surgeon, patient can have a repeat chest x-ray at 1 AM.  If no pneumothorax, he can be discharged home. ? ?Repeat chest x-ray shows multiple rib fractures but no evidence of pneumothorax.  On my evaluation, patient is 92% on room air.  He is a smoker.  I am concerned about possible complications related to rib fractures given that he has multiple and also sustained a small pneumothorax.  He is at high risk for pneumonia.  Patient states he has had rib fractures before and is familiar with treatment.  He prefers to go home.  I did offer him admission as I feel that he meets criteria given his multitrauma and significant risk for complications.  Patient declined.  I have ordered incentive spirometry and inhaler.  I encouraged him to be aggressive with his pain control at home and pulmonary toilet.  He and his wife stated understanding. ? ?Physical Exam  ?BP 123/80   Pulse 78   Temp 98.1 ?F (36.7 ?C)   Resp 18   Ht 1.88 m (6\' 2" )   Wt 108.9 kg   SpO2 92%   BMI 30.81 kg/m?  ? ?Physical Exam ?Awake, alert, no acute distress ?C-collar in place ?No respiratory distress ?Procedures  ?Procedures ? ?ED Course / MDM  ?  ?Medical Decision Making ?Amount and/or Complexity of Data Reviewed ?Labs: ordered. ?Radiology: ordered. ? ?Risk ?OTC drugs. ?Prescription drug management. ? ? ?Problem List Items Addressed This Visit   ?None ?Visit Diagnoses   ? ? Closed fracture of multiple ribs of right side, initial encounter    -  Primary  ? Traumatic pneumothorax, initial encounter      ? Contusion of right elbow, initial encounter      ? Laceration of right upper arm, initial encounter       ? Other closed nondisplaced fracture of seventh cervical vertebra, initial encounter (HCC)      ? ?  ? ? ? ? ? ? ?  ? , MD ?09/14/21 0158 ? ?

## 2023-08-04 ENCOUNTER — Other Ambulatory Visit: Payer: Self-pay

## 2023-08-04 ENCOUNTER — Encounter (HOSPITAL_COMMUNITY): Payer: Self-pay

## 2023-08-04 ENCOUNTER — Observation Stay (HOSPITAL_COMMUNITY)
Admission: EM | Admit: 2023-08-04 | Discharge: 2023-08-05 | Disposition: A | Discharge status: Home / Self Care | Payer: MEDICAID | Attending: Internal Medicine | Admitting: Internal Medicine

## 2023-08-04 ENCOUNTER — Emergency Department (HOSPITAL_COMMUNITY): Payer: MEDICAID

## 2023-08-04 DIAGNOSIS — R079 Chest pain, unspecified: Principal | ICD-10-CM | POA: Diagnosis present

## 2023-08-04 DIAGNOSIS — I160 Hypertensive urgency: Principal | ICD-10-CM

## 2023-08-04 DIAGNOSIS — R519 Headache, unspecified: Secondary | ICD-10-CM | POA: Insufficient documentation

## 2023-08-04 DIAGNOSIS — R072 Precordial pain: Secondary | ICD-10-CM

## 2023-08-04 DIAGNOSIS — R7989 Other specified abnormal findings of blood chemistry: Secondary | ICD-10-CM | POA: Insufficient documentation

## 2023-08-04 DIAGNOSIS — I10 Essential (primary) hypertension: Secondary | ICD-10-CM | POA: Insufficient documentation

## 2023-08-04 DIAGNOSIS — F17210 Nicotine dependence, cigarettes, uncomplicated: Secondary | ICD-10-CM | POA: Insufficient documentation

## 2023-08-04 LAB — BASIC METABOLIC PANEL
Anion gap: 11 (ref 5–15)
BUN: 10 mg/dL (ref 6–20)
CO2: 24 mmol/L (ref 22–32)
Calcium: 9 mg/dL (ref 8.9–10.3)
Chloride: 104 mmol/L (ref 98–111)
Creatinine, Ser: 1.11 mg/dL (ref 0.61–1.24)
GFR, Estimated: >60 mL/min (ref >60)
Glucose, Bld: 94 mg/dL (ref 70–99)
Potassium: 4 mmol/L (ref 3.5–5.1)
Sodium: 139 mmol/L (ref 135–145)

## 2023-08-04 LAB — CBC
HCT: 46.6 % (ref 39.0–52.0)
Hemoglobin: 15.6 g/dL (ref 13.0–17.0)
MCH: 32.8 pg (ref 26.0–34.0)
MCHC: 33.5 g/dL (ref 30.0–36.0)
MCV: 98.1 fL (ref 80.0–100.0)
Platelets: 302 10*3/uL (ref 150–400)
RBC: 4.75 MIL/uL (ref 4.22–5.81)
RDW: 13.2 % (ref 11.5–15.5)
WBC: 6.9 10*3/uL (ref 4.0–10.5)
nRBC: 0 % (ref 0.0–0.2)

## 2023-08-04 LAB — TROPONIN I (HIGH SENSITIVITY): Troponin I (High Sensitivity): 49 ng/L — ABNORMAL HIGH (ref <18)

## 2023-08-04 NOTE — ED Provider Notes (Signed)
 [place] EMERGENCY DEPARTMENT AT [facility] Provider Note   CSN: [id number] Arrival date & time: 08/04/23  2027     History {Add pertinent medical, surgical, social history, OB history to HPI:1} Chief Complaint  Patient presents with   Hypertension   Chest Pain    Ricky Casey is a 63 y.o. male with past medical history of hypertension presenting to emergency room with complaint of chest discomfort.  Patient reporting mild central chest pain that seems to be radiating to the right side of his neck.  He reports pain is constant and has been present for the past 2 or 3 days.  He does not note any worsening symptoms with exertion.  Has not noticed any shortness of breath.  He also complains of a mild frontal headache that has been present for 2 days.  Does not have any focal deficits associated with headache.  No blurry vision or change in vision.  He does report that his blood pressure has been quite elevated at home.  He has noted readings around 180 systolic. He denies any recent illness.  Has not noticed any swelling in feet and ankles.  Has not had any calf tenderness or pain.  No recent travel.  No recent surgery.  Hypertension Associated symptoms include chest pain.  Chest Pain      Home Medications Prior to Admission medications   Medication Sig Start Date End Date Taking? Authorizing Provider  aspirin EC 81 MG tablet Take 81 mg by mouth daily. Swallow whole.   Yes [provider]      Allergies    Morphine and codeine    Review of Systems   Review of Systems  Cardiovascular:  Positive for chest pain.    Physical Exam Updated Vital Signs BP (!) 204/117 (BP Location: Right Arm)   Pulse 63   Temp 98.6 F (37 C)   Resp 17   Ht 6\' 2"  (1.88 m)   Wt 99.8 kg   SpO2 100%   BMI 28.25 kg/m  Physical Exam Vitals and nursing note reviewed.  Constitutional:      General: He is not in acute distress.    Appearance: He is not toxic-appearing.   HENT:     Head: Normocephalic and atraumatic.  Eyes:     General: No scleral icterus.    Conjunctiva/sclera: Conjunctivae normal.  Cardiovascular:     Rate and Rhythm: Normal rate and regular rhythm.     Pulses: Normal pulses.     Heart sounds: Normal heart sounds.  Pulmonary:     Effort: Pulmonary effort is normal. No respiratory distress.     Breath sounds: Normal breath sounds.  Chest:     Chest wall: No tenderness.  Abdominal:     General: Abdomen is flat. Bowel sounds are normal.     Palpations: Abdomen is soft.     Tenderness: There is no abdominal tenderness.  Musculoskeletal:     Right lower leg: No edema.     Left lower leg: No edema.  Skin:    General: Skin is warm and dry.     Findings: No lesion.  Neurological:     General: No focal deficit present.     Mental Status: He is alert and oriented to person, place, and time. Mental status is at baseline.     ED Results / Procedures / Treatments   Labs (all labs ordered are listed, but only abnormal results are displayed) Labs Reviewed  TROPONIN  I (HIGH SENSITIVITY) - Abnormal; Notable for the following components:      Result Value   Troponin I (High Sensitivity) 49 (*)    All other components within normal limits  BASIC METABOLIC PANEL  CBC  TROPONIN I (HIGH SENSITIVITY)    EKG EKG Interpretation Date/Time:  Wednesday August 04 2023 20:32:46 EDT Ventricular Rate:  75 PR Interval:  142 QRS Duration:  88 QT Interval:  406 QTC Calculation: 453 R Axis:   44  Text Interpretation: Normal sinus rhythm Nonspecific ST abnormality Abnormal ECG No previous ECGs available Confirmed by Jacalyn Lefevre [phone]) on 08/04/2023 9:45:05 PM  Radiology CT Head Wo Contrast Result Date: 08/04/2023 CLINICAL DATA:  New onset headaches. EXAM: CT HEAD WITHOUT CONTRAST TECHNIQUE: Contiguous axial images were obtained from the base of the skull through the vertex without intravenous contrast. RADIATION DOSE REDUCTION: This exam  was performed according to the departmental dose-optimization program which includes automated exposure control, adjustment of the mA and/or kV according to patient size and/or use of iterative reconstruction technique. COMPARISON:  09/13/2021 FINDINGS: Brain: No evidence of acute infarction, hemorrhage, hydrocephalus, extra-axial collection or mass lesion/mass effect. Vascular: No hyperdense vessel or unexpected calcification. Skull: Normal. Negative for fracture or focal lesion. Sinuses/Orbits: No acute finding. Other: None. IMPRESSION: No acute intracranial abnormalities. Electronically Signed   By: Burman Nieves M.D.   On: 08/04/2023 23:00   DG Chest 2 View Result Date: 08/04/2023 CLINICAL DATA:  Chest pain, hypertension. EXAM: CHEST - 2 VIEW COMPARISON:  09/14/2021. FINDINGS: The heart size and mediastinal contours are within normal limits. There is blunting of the right costophrenic angle. No consolidation, effusion, or pneumothorax. Multiple old rib fractures are noted on the right. IMPRESSION: 1. Blunting of the right costophrenic angle, possible atelectasis, scarring, or trace pleural effusion. 2. Multiple old rib fractures on the right. Electronically Signed   By: Thornell Sartorius M.D.   On: 08/04/2023 21:31    Procedures Procedures  {Document cardiac monitor, telemetry assessment procedure when appropriate:1}  Medications Ordered in ED Medications - No data to display  ED Course/ Medical Decision Making/ A&P   {   Click here for ABCD2, HEART and other calculatorsREFRESH Note before signing :1}                              Medical Decision Making Amount and/or Complexity of Data Reviewed Labs: ordered. Radiology: ordered.   ***  {Document critical care time when appropriate:1} {Document review of labs and clinical decision tools ie heart score, Chads2Vasc2 etc:1}  {Document your independent review of radiology images, and any outside records:1} {Document your discussion with  family members, caretakers, and with consultants:1} {Document social determinants of health affecting pt's care:1} {Document your decision making why or why not admission, treatments were needed:1} Final Clinical Impression(s) / ED Diagnoses Final diagnoses:  None    Rx / DC Orders ED Discharge Orders     None

## 2023-08-04 NOTE — ED Provider Notes (Signed)
 Patient care taken over at shift change. Will get delta troponin and admit for chest pain.  Physical Exam  BP (!) 159/118   Pulse 92   Temp 98.6 F (37 C)   Resp (!) 25   Ht 6\' 2"  (1.88 m)   Wt 99.8 kg   SpO2 95%   BMI 28.25 kg/m   Physical Exam  Procedures  Procedures  ED Course / MDM    Medical Decision Making Amount and/or Complexity of Data Reviewed Labs: ordered. Radiology: ordered.  Risk OTC drugs. Decision regarding hospitalization.   Patient reports persistent chest pain for the past 3 days.  States he was at home resting well and started and persisted ever since.  Denies any similar episodes in the past.  Has a history of hypertension but is not on any blood pressure medications.  Has been having intermittent headaches as well.  Took his blood pressure at home with a wrist cuff and was elevated in 200s systolically.  Came to the ED for further evaluation. Has some headache at the time of my evaluation but denies chest pain.  I ordered Tylenol which did help improve his headache some.  Heart score of 4. Stable initial and delta troponin of 49.   Initial BP of 204/117 in triage. BP of 156/110 when I took over care. At 0230, BP is 146/90.   I consulted and spoke with Dr. Loney Loh with [facility] who will admit patient for further evaluation and management of symptoms.    Patient is agreeable with plan.   Maxwell Marion, PA-C 08/05/23 0301    Mesner, Barbara Cower, MD 08/05/23 [phone]

## 2023-08-04 NOTE — ED Triage Notes (Signed)
 Pt arrived from home via POV c/o HTN and chest pain 3/10 described as pressure accompanied with posterior neck pain 4/10 and intermittent diaphoretic episodes. Pt c/o headache 2/10. Denies any blurred vision.

## 2023-08-05 ENCOUNTER — Other Ambulatory Visit (HOSPITAL_COMMUNITY): Payer: Self-pay

## 2023-08-05 ENCOUNTER — Emergency Department (HOSPITAL_BASED_OUTPATIENT_CLINIC_OR_DEPARTMENT_OTHER): Payer: MEDICAID

## 2023-08-05 DIAGNOSIS — R7989 Other specified abnormal findings of blood chemistry: Secondary | ICD-10-CM

## 2023-08-05 DIAGNOSIS — R079 Chest pain, unspecified: Secondary | ICD-10-CM | POA: Diagnosis present

## 2023-08-05 LAB — ECHOCARDIOGRAM COMPLETE
AR max vel: 3.05 cm2
AV Area VTI: 3.38 cm2
AV Area mean vel: 3.15 cm2
AV Mean grad: 5 mmHg
AV Peak grad: 9.9 mmHg
Ao pk vel: 1.57 m/s
Area-P 1/2: 2.87 cm2
Height: 74 in
S' Lateral: 3.4 cm
Weight: 3520 [oz_av]

## 2023-08-05 LAB — TROPONIN I (HIGH SENSITIVITY): Troponin I (High Sensitivity): 49 ng/L — ABNORMAL HIGH (ref <18)

## 2023-08-05 MED ORDER — BISACODYL 5 MG PO TBEC: 5.0000 mg | DELAYED_RELEASE_TABLET | Freq: Every day | ORAL | Status: DC | PRN

## 2023-08-05 MED ORDER — ALBUTEROL SULFATE (2.5 MG/3ML) 0.083% IN NEBU: 2.5000 mg | INHALATION_SOLUTION | Freq: Four times a day (QID) | RESPIRATORY_TRACT | Status: DC | PRN

## 2023-08-05 MED ORDER — ACETAMINOPHEN 650 MG RE SUPP: 650.0000 mg | Freq: Four times a day (QID) | RECTAL | Status: DC | PRN

## 2023-08-05 MED ORDER — AMLODIPINE BESYLATE 5 MG PO TABS
5.0000 mg | ORAL_TABLET | Freq: Every day | ORAL | 0 refills | Status: DC
  Filled 2023-08-05: qty 30, 30d supply, fill #0

## 2023-08-05 MED ORDER — ENOXAPARIN SODIUM 40 MG/0.4ML IJ SOSY
40.0000 mg | PREFILLED_SYRINGE | INTRAMUSCULAR | Status: DC
Start: 2023-08-06 — End: 2023-08-05

## 2023-08-05 MED ORDER — POLYETHYLENE GLYCOL 3350 17 G PO PACK: 17.0000 g | PACK | Freq: Every day | ORAL | Status: DC | PRN

## 2023-08-05 MED ORDER — HYDRALAZINE HCL 20 MG/ML IJ SOLN: 10.0000 mg | Freq: Four times a day (QID) | INTRAMUSCULAR | Status: DC | PRN

## 2023-08-05 MED ORDER — ACETAMINOPHEN 325 MG PO TABS
650.0000 mg | ORAL_TABLET | Freq: Once | ORAL | Status: AC
  Administered 2023-08-05: 650 mg via ORAL
  Filled 2023-08-05: qty 2

## 2023-08-05 MED ORDER — HYDROCHLOROTHIAZIDE 12.5 MG PO TABS
12.5000 mg | ORAL_TABLET | Freq: Every day | ORAL | 0 refills | Status: DC
  Filled 2023-08-05: qty 30, 30d supply, fill #0

## 2023-08-05 MED ORDER — ACETAMINOPHEN 325 MG PO TABS: 650.0000 mg | ORAL_TABLET | Freq: Four times a day (QID) | ORAL | Status: DC | PRN

## 2023-08-05 MED ORDER — AMLODIPINE BESYLATE 5 MG PO TABS
5.0000 mg | ORAL_TABLET | Freq: Every day | ORAL | Status: DC
  Administered 2023-08-05: 5 mg via ORAL
  Filled 2023-08-05: qty 1

## 2023-08-05 NOTE — ED Notes (Signed)
 Patient transported to echo ?

## 2023-08-05 NOTE — Consult Note (Addendum)
 [facility] CONSULT NOTE  Ricky Casey NWG:[id number] DOB: Feb 11, 1961 DOA: 08/04/2023 PCP: Pcp, No  Presented from: Home Chief Complaint: Chest pain, increased sleepiness   History of Present Illness: Ricky Casey is a 63 y.o. male with PMH significant for HTN not on meds, 1 pack/day smoking [place] and is physically active. Presented to the ED last night with complaint of sudden onset chest pain radiating to his neck.  He reports for the last few weeks, he has few episodes of off and on chest pain with no clear exacerbating or relieving factors.  The episode last night was little more severe and hence she came to the ED.  Also reports increased sleepiness for last several days.  He feels sleepy during the day despite sleeping 12 hours in the night. No prior history of heart disease, sleep apnea. He used to be on blood pressure medicines which she self stopped.  Says he is trying diet control. 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  Overnight blood pressure improved down to 150s. Breathing on room air CBC, BMP unremarkable. Troponin mildly elevated and stable 49 >49 CT scan of head unremarkable Chest x-ray showed -Blunting of the right costophrenic angle, possible atelectasis, scarring, or trace pleural effusion. -Multiple old rib fractures on the right. EKG showed normal sinus rhythm and nonspecific ST-T wave abnormality Chest pain resolved in the ED with Tylenol  Hospitalist service was consulted for further workup because of elevated troponin. I received this patient as a carryover admission from last night  At the time of my evaluation, patient was lying in bed.  Not in distress. Blood pressure was in 170s No family at bedside History reviewed and  detailed as above.  Review of Systems:  All systems were reviewed and were negative unless otherwise mentioned in the HPI   Past medical history: Past Medical History:  Diagnosis Date   Depression    Hypertension     Past surgical history: Past Surgical History:  Procedure Laterality Date   APPENDECTOMY     BACK SURGERY     PATELLA FRACTURE SURGERY      Social History:  reports that he has been smoking cigarettes. He does not have any smokeless tobacco history on file. He reports that he does not drink alcohol and does not use drugs.  Allergies:  Allergies  Allergen Reactions   Morphine And Codeine Other (See Comments)    Severe migraine   Morphine and codeine   Family history:  History reviewed. No pertinent family history.   Physical Exam: Vitals:   08/05/23 1000 08/05/23 1100 08/05/23 1305 08/05/23 1330  BP: (!) 161/105 (!) 143/92 (!) 173/118 (!) 161/107  Pulse: (!) 51 69 61 78  Resp: 18 18 12 18   Temp:   98.7 F (37.1 C)   TempSrc:   Oral   SpO2: 96% 96% 98% 96%  Weight:      Height:       Wt Readings from Last 3 Encounters:  08/04/23 99.8 kg  09/13/21 108.9 kg  05/02/15 108.9 kg   Body mass index is 28.25 kg/m.  General exam: Pleasant, middle-aged Caucasian male.  Not in distress Skin: No rashes, lesions or ulcers. HEENT: Atraumatic, normocephalic, no obvious bleeding Lungs: Clear  to auscultation bilaterally,  CVS: S1, S2, no murmur, no midsternal tenderness on touch  GI/Abd: Soft, nontender, nondistended, bowel sound present,   CNS: Alert, awake, oriented x 3 Psychiatry: Mood appropriate,  Extremities: No pedal edema, no calf tenderness,     ----------------------------------------------------------------------------------------------------------------------------------------- ----------------------------------------------------------------------------------------------------------------------------------------- -----------------------------------------------------------------------------------------------------------------------------------------  Hospital course: Chest pain Mildly elevated troponin  Presented to the ED last night with complaint of sudden onset chest pain radiating to his neck.  He reports for the last few weeks, he has few episodes of off and on chest pain with no clear exacerbating or relieving factors.  The episode last night was little more severe and hence she came to the ED.  Troponin mildly elevated to 49 >49 No prior cardiac history and has not noted any recent limitation in physical capacity. But he has risk factors for CAD that include uncontrolled hypertension and 1 pack/day smoking for several years. Echo was obtained.  It showed normal EF 55 to 60%, no regional wall motion abnormality, mild LVH, G1DD. Elevated troponin was likely demand ischemia In the last several hours in the ED, patient has no symptoms.  Will discharge him to follow-up with cardiology as an outpatient for further ischemia workup. Continue baby aspirin as before  Multiple old rib fractures on the right Noted on chest x-ray.  Patient denies any recent trauma. He is not tender to touch.  Uncontrolled hypertension Blood pressure initially was elevated to 204/117, likely pain/stress related Without meds, blood pressure improved in stayed in 150s overnight. Previously used to be on meds but self stopped several months ago. Started amlodipine 5 mg this morning.  Blood pressure still remains elevated.  Will add hydrochlorothiazide 12.5 mg daily.  Both prescriptions sent to [facility] pharmacy  1 pack/day smoking Counseled to  quit Nicotine patch offered  Recommendations at discharge:  You have been started on amlodipine 5 mg daily and hydrochlorothiazide 12.5 mg daily. Continue to monitor blood pressure at home.  Follow-up with PCP for further adjustment Outpatient referral given for cardiology evaluation.  May need stress test as outpatient.    Mobility: Encourage ambulation  Goals of care:   Code Status: Full Code    DVT prophylaxis:  enoxaparin (LOVENOX) injection 40 mg Start: 08/06/23 1000   Antimicrobials: None Fluid: None Consultants: None Family Communication: None at bedside   Diet: Diet Order             Diet Heart Room service appropriate? Yes; Fluid consistency: Thin  Diet effective now           Diet - low sodium heart healthy                   Diet:  Diet Order             Diet Heart Room service appropriate? Yes; Fluid consistency: Thin  Diet effective now           Diet - low sodium heart healthy                   Nutritional status:  Body mass index is 28.25 kg/m.       Wounds:  -    Discharge Exam:   Vitals:   08/05/23 1000 08/05/23 1100 08/05/23 1305 08/05/23 1330  BP: (!) 161/105 (!) 143/92 (!) 173/118 (!) 161/107  Pulse: (!) 51 69 61 78  Resp: 18 18 12 18   Temp:   98.7 F (37.1 C)   TempSrc:   Oral   SpO2: 96%  96% 98% 96%  Weight:      Height:        Follow ups:    Follow-up Information      [facility] Follow up.   Contact information: [address] [place] [address] [phone]                Discharge Instructions:   Discharge Instructions     Ambulatory referral to Cardiology   Complete by: As directed    May need ischemia workup.   Call MD for:  difficulty breathing, headache or visual disturbances   Complete by: As directed    Call MD for:  extreme fatigue   Complete by: As directed    Call MD for:  hives   Complete by: As directed    Call  MD for:  persistant dizziness or light-headedness   Complete by: As directed    Call MD for:  persistant nausea and vomiting   Complete by: As directed    Call MD for:  severe uncontrolled pain   Complete by: As directed    Call MD for:  temperature >100.4   Complete by: As directed    Diet - low sodium heart healthy   Complete by: As directed    Discharge instructions   Complete by: As directed    Recommendations at discharge:   You have been started on amlodipine 5 mg daily and hydrochlorothiazide 12.5 mg daily.  Continue to monitor blood pressure at home.  Follow-up with PCP for further adjustment  Outpatient referral given for cardiology evaluation.  May need stress test as outpatient.  General discharge instructions: Follow with Primary MD Pcp, No in 7 days  Please request your PCP  to go over your hospital tests, procedures, radiology results at the follow up. Please get your medicines reviewed and adjusted.  Your PCP may decide to repeat certain labs or tests as needed. Do not drive, operate heavy machinery, perform activities at heights, swimming or participation in water activities or provide baby sitting services if your were admitted for syncope or siezures until you have seen by Primary MD or a Neurologist and advised to do so again. [place] Controlled Substance Reporting System database was reviewed. Do not drive, operate heavy machinery, perform activities at heights, swim, participate in water activities or provide baby-sitting services while on medications for pain, sleep and mood until your outpatient physician has reevaluated you and advised to do so again.  You are strongly recommended to comply with the dose, frequency and duration of prescribed medications. Activity: As tolerated with Full fall precautions use walker/cane & assistance as needed Avoid using any recreational substances like cigarette, tobacco, alcohol, or non-prescribed drug. If you experience  worsening of your admission symptoms, develop shortness of breath, life threatening emergency, suicidal or homicidal thoughts you must seek medical attention immediately by calling 911 or calling your MD immediately  if symptoms less severe. You must read complete instructions/literature along with all the possible adverse reactions/side effects for all the medicines you take and that have been prescribed to you. Take any new medicine only after you have completely understood and accepted all the possible adverse reactions/side effects.  Wear Seat belts while driving. You were cared for by a hospitalist during your hospital stay. If you have any questions about your discharge medications or the care you received while you were in the hospital after you are discharged, you can call the unit  and ask to speak with the hospitalist or the covering physician. Once you are discharged, your primary care physician will handle any further medical issues. Please note that NO REFILLS for any discharge medications will be authorized once you are discharged, as it is imperative that you return to your primary care physician (or establish a relationship with a primary care physician if you do not have one).   Increase activity slowly   Complete by: As directed        Discharge Medications:   Allergies as of 08/05/2023       Reactions   Morphine And Codeine Other (See Comments)   Severe migraine        Medication List     TAKE these medications    amLODipine 5 MG tablet Commonly known as: NORVASC Take 1 tablet (5 mg total) by mouth daily. Start taking on: August 06, 2023   aspirin EC 81 MG tablet Take 81 mg by mouth daily. Swallow whole.   hydrochlorothiazide 12.5 MG tablet Commonly known as: HYDRODIURIL Take 1 tablet (12.5 mg total) by mouth daily.         The results of significant diagnostics from this hospitalization (including imaging, microbiology, ancillary and laboratory) are listed  below for reference.    Procedures and Diagnostic Studies:   ECHOCARDIOGRAM COMPLETE Result Date: 08/05/2023    ECHOCARDIOGRAM REPORT   Patient Name:   NAVI ERBER Date of Exam: 08/05/2023 Medical Rec #:  [id number]       Height:       74.0 in Accession #:    [id number]      Weight:       220.0 lb Date of Birth:  1960-11-13       BSA:          2.263 m Patient Age:    62 years        BP:           173/118 mmHg Patient Gender: M               HR:           60 bpm. Exam Location:  Inpatient Procedure: 2D Echo, Cardiac Doppler and Color Doppler (Both Spectral and Color            Flow Doppler were utilized during procedure). Indications:    Chest Pain  History:        Patient has no prior history of Echocardiogram examinations.                 Risk Factors:Hypertension.  Sonographer:    Melton Krebs RDCS, FE, PE Referring Phys: [id number] [facility] Justina Bertini IMPRESSIONS  1. Left ventricular ejection fraction, by estimation, is 55 to 60%. The left ventricle has normal function. The left ventricle has no regional wall motion abnormalities. There is mild left ventricular hypertrophy. Left ventricular diastolic parameters are consistent with Grade I diastolic dysfunction (impaired relaxation).  2. Right ventricular systolic function is normal. The right ventricular size is normal.  3. Left atrial size was mildly dilated.  4. The mitral valve is normal in structure. No evidence of mitral valve regurgitation. No evidence of mitral stenosis.  5. The aortic valve is tricuspid. There is mild calcification of the aortic valve. Aortic valve regurgitation is not visualized. Aortic valve sclerosis is present, with no evidence of aortic valve stenosis. Aortic valve Vmax measures 1.57 m/s.  6. The inferior vena cava is normal in size with greater than 50%  respiratory variability, suggesting right atrial pressure of 3 mmHg. FINDINGS  Left Ventricle: Left ventricular ejection fraction, by estimation, is 55 to 60%. The left ventricle has  normal function. The left ventricle has no regional wall motion abnormalities. The left ventricular internal cavity size was normal in size. There is  mild left ventricular hypertrophy. Left ventricular diastolic parameters are consistent with Grade I diastolic dysfunction (impaired relaxation). Right Ventricle: The right ventricular size is normal. No increase in right ventricular wall thickness. Right ventricular systolic function is normal. Left Atrium: Left atrial size was mildly dilated. Right Atrium: Right atrial size was normal in size. Pericardium: There is no evidence of pericardial effusion. Mitral Valve: The mitral valve is normal in structure. Mild mitral annular calcification. No evidence of mitral valve regurgitation. No evidence of mitral valve stenosis. Tricuspid Valve: The tricuspid valve is normal in structure. Tricuspid valve regurgitation is not demonstrated. No evidence of tricuspid stenosis. Aortic Valve: The aortic valve is tricuspid. There is mild calcification of the aortic valve. Aortic valve regurgitation is not visualized. Aortic valve sclerosis is present, with no evidence of aortic valve stenosis. Aortic valve mean gradient measures 5.0 mmHg. Aortic valve peak gradient measures 9.9 mmHg. Aortic valve area, by VTI measures 3.38 cm. Pulmonic Valve: The pulmonic valve was normal in structure. Pulmonic valve regurgitation is not visualized. No evidence of pulmonic stenosis. Aorta: The aortic root is normal in size and structure. Venous: The inferior vena cava is normal in size with greater than 50% respiratory variability, suggesting right atrial pressure of 3 mmHg. IAS/Shunts: No atrial level shunt detected by color flow Doppler.  LEFT VENTRICLE PLAX 2D LVIDd:         5.00 cm   Diastology LVIDs:         3.40 cm   LV e' medial:    5.75 cm/s LV PW:         1.10 cm   LV E/e' medial:  13.3 LV IVS:        1.20 cm   LV e' lateral:   7.62 cm/s LVOT diam:     2.40 cm   LV E/e' lateral: 10.0 LV  SV:         109 LV SV Index:   48 LVOT Area:     4.52 cm  RIGHT VENTRICLE RV Basal diam:  3.40 cm RV Mid diam:    2.80 cm RV S prime:     12.00 cm/s TAPSE (M-mode): 1.9 cm LEFT ATRIUM             Index        RIGHT ATRIUM           Index LA diam:        3.70 cm 1.63 cm/m   RA Area:     20.00 cm LA Vol (A2C):   93.0 ml 41.09 ml/m  RA Volume:   51.80 ml  22.89 ml/m LA Vol (A4C):   68.8 ml 30.40 ml/m LA Biplane Vol: 85.1 ml 37.60 ml/m  AORTIC VALVE AV Area (Vmax):    3.05 cm AV Area (Vmean):   3.15 cm AV Area (VTI):     3.38 cm AV Vmax:           157.00 cm/s AV Vmean:          106.500 cm/s AV VTI:            0.324 m AV Peak Grad:      9.9 mmHg AV Mean  Grad:      5.0 mmHg LVOT Vmax:         106.00 cm/s LVOT Vmean:        74.100 cm/s LVOT VTI:          0.242 m LVOT/AV VTI ratio: 0.75  AORTA Ao Root diam: 3.85 cm Ao Asc diam:  3.80 cm MITRAL VALVE                TRICUSPID VALVE MV Area (PHT): 2.87 cm     TR Peak grad:   16.2 mmHg MV Decel Time: 264 msec     TR Vmax:        201.00 cm/s MV E velocity: 76.30 cm/s MV A velocity: 103.00 cm/s  SHUNTS MV E/A ratio:  0.74         Systemic VTI:  0.24 m                             Systemic Diam: 2.40 cm Donato Schultz MD Electronically signed by Donato Schultz MD Signature Date/Time: 08/05/2023/3:16:53 PM    Final    CT Head Wo Contrast Result Date: 08/04/2023 CLINICAL DATA:  New onset headaches. EXAM: CT HEAD WITHOUT CONTRAST TECHNIQUE: Contiguous axial images were obtained from the base of the skull through the vertex without intravenous contrast. RADIATION DOSE REDUCTION: This exam was performed according to the departmental dose-optimization program which includes automated exposure control, adjustment of the mA and/or kV according to patient size and/or use of iterative reconstruction technique. COMPARISON:  09/13/2021 FINDINGS: Brain: No evidence of acute infarction, hemorrhage, hydrocephalus, extra-axial collection or mass lesion/mass effect. Vascular: No hyperdense  vessel or unexpected calcification. Skull: Normal. Negative for fracture or focal lesion. Sinuses/Orbits: No acute finding. Other: None. IMPRESSION: No acute intracranial abnormalities. Electronically Signed   By: Burman Nieves M.D.   On: 08/04/2023 23:00   DG Chest 2 View Result Date: 08/04/2023 CLINICAL DATA:  Chest pain, hypertension. EXAM: CHEST - 2 VIEW COMPARISON:  09/14/2021. FINDINGS: The heart size and mediastinal contours are within normal limits. There is blunting of the right costophrenic angle. No consolidation, effusion, or pneumothorax. Multiple old rib fractures are noted on the right. IMPRESSION: 1. Blunting of the right costophrenic angle, possible atelectasis, scarring, or trace pleural effusion. 2. Multiple old rib fractures on the right. Electronically Signed   By: Thornell Sartorius M.D.   On: 08/04/2023 21:31     Labs:   Basic Metabolic Panel: Recent Labs  Lab 08/04/23 2100  NA 139  K 4.0  CL 104  CO2 24  GLUCOSE 94  BUN 10  CREATININE 1.11  CALCIUM 9.0   GFR Estimated Creatinine Clearance: 89.3 mL/min (by C-G formula based on SCr of 1.11 mg/dL). Liver Function Tests: No results for input(s): "AST", "ALT", "ALKPHOS", "BILITOT", "PROT", "ALBUMIN" in the last 168 hours. No results for input(s): "LIPASE", "AMYLASE" in the last 168 hours. No results for input(s): "AMMONIA" in the last 168 hours. Coagulation profile No results for input(s): "INR", "PROTIME" in the last 168 hours.  CBC: Recent Labs  Lab 08/04/23 2100  WBC 6.9  HGB 15.6  HCT 46.6  MCV 98.1  PLT 302   Cardiac Enzymes: No results for input(s): "CKTOTAL", "CKMB", "CKMBINDEX", "TROPONINI" in the last 168 hours. BNP: Invalid input(s): "POCBNP" CBG: No results for input(s): "GLUCAP" in the last 168 hours. D-Dimer No results for input(s): "DDIMER" in the last 72 hours. Hgb A1c No results for  input(s): "HGBA1C" in the last 72 hours. Lipid Profile No results for input(s): "CHOL", "HDL",  "LDLCALC", "TRIG", "CHOLHDL", "LDLDIRECT" in the last 72 hours. Thyroid function studies No results for input(s): "TSH", "T4TOTAL", "T3FREE", "THYROIDAB" in the last 72 hours.  Invalid input(s): "FREET3" Anemia work up No results for input(s): "VITAMINB12", "FOLATE", "FERRITIN", "TIBC", "IRON", "RETICCTPCT" in the last 72 hours. Microbiology No results found for this or any previous visit (from the past 240 hours).  Time coordinating discharge: 85 minutes  Signed: Ryota Treece  [facility] 08/05/2023, 5:05 PM

## 2023-08-05 NOTE — ED Notes (Signed)
 Called and placed PT on CCMD monitoring

## 2023-08-11 ENCOUNTER — Ambulatory Visit: Payer: Self-pay | Admitting: Physician Assistant

## 2023-08-11 VITALS — BP 142/101 | Wt 219.0 lb

## 2023-08-11 DIAGNOSIS — Z7689 Persons encountering health services in other specified circumstances: Secondary | ICD-10-CM

## 2023-08-11 DIAGNOSIS — F32A Depression, unspecified: Secondary | ICD-10-CM | POA: Insufficient documentation

## 2023-08-11 DIAGNOSIS — I10 Essential (primary) hypertension: Secondary | ICD-10-CM | POA: Insufficient documentation

## 2023-08-11 DIAGNOSIS — F321 Major depressive disorder, single episode, moderate: Secondary | ICD-10-CM

## 2023-08-11 MED ORDER — LISINOPRIL 10 MG PO TABS: 10.0000 mg | ORAL_TABLET | Freq: Every day | ORAL | 1 refills | Status: DC

## 2023-08-11 MED ORDER — SERTRALINE HCL 50 MG PO TABS: 50.0000 mg | ORAL_TABLET | Freq: Every day | ORAL | 1 refills | Status: DC

## 2023-08-11 NOTE — Assessment & Plan Note (Signed)
 Patient reporting depression symptoms since December. With consideration of decreased interest, decreased motivation, and social isolation, will start patient on Zoloft today. Patient counseled on side effects such as nausea and headache, she was advised to stop taking medication if she experiences suicidal ideation. We will follow up in 6 weeks.

## 2023-08-11 NOTE — Assessment & Plan Note (Signed)
 150/90, 142/101 Uncontrolled. Continue current medications. Adding lisinopril 10mg  daily. Discussed DASH diet and dietary sodium restrictions.  Continue dietary efforts and physical activity. Will follow up in 6 weeks.

## 2023-08-11 NOTE — Progress Notes (Signed)
 New Patient Office Visit  Subjective    Patient ID: Ricky Casey, male    DOB: 1960-06-02  Age: 63 y.o. MRN: [id number]  CC:  Chief Complaint  Patient presents with   Establish Care    ER follow up    HPI Ricky Casey presents to establish care  Patient presents today for ER follow up regarding high blood pressure. Patient recently started on amlodipine and hydrochlorothiazide. He has been monitoring and recording blood pressures at home. He reports associated headaches and lightheadedness. He also reports concerns for depression. Stating since December he has felt decreased motivation, less interest in life, and withdrawn from family and friends. He denies SI or HI.   Chart review of recent ER visit and observation done prior to today's encounter.   Outpatient Encounter Medications as of 08/11/2023  Medication Sig   lisinopril (ZESTRIL) 10 MG tablet Take 1 tablet (10 mg total) by mouth daily.   sertraline (ZOLOFT) 50 MG tablet Take 1 tablet (50 mg total) by mouth daily.   amLODipine (NORVASC) 5 MG tablet Take 1 tablet (5 mg total) by mouth daily.   aspirin EC 81 MG tablet Take 81 mg by mouth daily. Swallow whole.   hydrochlorothiazide (HYDRODIURIL) 12.5 MG tablet Take 1 tablet (12.5 mg total) by mouth daily.   No facility-administered encounter medications on file as of 08/11/2023.    Past Medical History:  Diagnosis Date   Depression    Hypertension     Past Surgical History:  Procedure Laterality Date   APPENDECTOMY     BACK SURGERY     PATELLA FRACTURE SURGERY      History reviewed. No pertinent family history.  Social History   Socioeconomic History   Marital status: Divorced    Spouse name: Not on file   Number of children: Not on file   Years of education: Not on file   Highest education level: Some college, no degree  Occupational History   Not on file  Tobacco Use   Smoking status: Every Day    Current packs/day: 1.00    Types: Cigarettes    Smokeless tobacco: Not on file  Substance and Sexual Activity   Alcohol use: No   Drug use: No   Sexual activity: Not on file  Other Topics Concern   Not on file  Social History Narrative   Not on file   Social Drivers of Health   Financial Resource Strain: High Risk (08/11/2023)   Overall Financial Resource Strain (CARDIA)    Difficulty of Paying Living Expenses: Very hard  Food Insecurity: Food Insecurity Present (08/11/2023)   Hunger Vital Sign    Worried About Running Out of Food in the Last Year: Never true    Ran Out of Food in the Last Year: Sometimes true  Transportation Needs: No Transportation Needs (08/11/2023)   PRAPARE - Administrator, Civil Service (Medical): No    Lack of Transportation (Non-Medical): No  Physical Activity: Insufficiently Active (08/11/2023)   Exercise Vital Sign    Days of Exercise per Week: 5 days    Minutes of Exercise per Session: 20 min  Stress: Stress Concern Present (08/11/2023)   [employer] of Occupational Health - Occupational Stress Questionnaire    Feeling of Stress : To some extent  Social Connections: Moderately Isolated (08/11/2023)   Social Connection and Isolation Panel [NHANES]    Frequency of Communication with Friends and Family: More than three times a week  Frequency of Social Gatherings with Friends and Family: More than three times a week    Attends Religious Services: 1 to 4 times per year    Active Member of [employer] or Organizations: No    Attends Banker Meetings: Not on file    Marital Status: Divorced  Intimate Partner Violence: Not At Risk (08/05/2023)   Humiliation, Afraid, Rape, and Kick questionnaire    Fear of Current or Ex-Partner: No    Emotionally Abused: No    Physically Abused: No    Sexually Abused: No    Review of Systems  Constitutional:  Positive for diaphoresis. Negative for chills, fever and malaise/fatigue.  Eyes:  Negative for blurred vision and double vision.   Respiratory:  Negative for cough and shortness of breath.   Cardiovascular:  Negative for chest pain and palpitations.  Gastrointestinal:  Negative for nausea and vomiting.  Neurological:  Positive for headaches.       Lightheadedness  Psychiatric/Behavioral:  Positive for depression. The patient is not nervous/anxious.         Objective    BP (!) 142/101   Wt 219 lb (99.3 kg)   BMI 28.12 kg/m   Physical Exam Constitutional:      Appearance: Normal appearance.  HENT:     Head: Normocephalic.     Mouth/Throat:     Mouth: Mucous membranes are moist.     Pharynx: Oropharynx is clear.  Eyes:     Extraocular Movements: Extraocular movements intact.     Conjunctiva/sclera: Conjunctivae normal.  Cardiovascular:     Rate and Rhythm: Normal rate and regular rhythm.     Heart sounds: Normal heart sounds. No murmur heard. Pulmonary:     Effort: Pulmonary effort is normal.     Breath sounds: Normal breath sounds.  Musculoskeletal:     Right lower leg: No edema.     Left lower leg: No edema.  Skin:    General: Skin is warm and dry.  Neurological:     General: No focal deficit present.     Mental Status: He is alert and oriented to person, place, and time.  Psychiatric:        Mood and Affect: Mood normal.        Behavior: Behavior normal.     Results for orders placed or performed during the hospital encounter of 08/04/23  Basic metabolic panel   Collection Time: 08/04/23  9:00 PM  Result Value Ref Range   Sodium 139 135 - 145 mmol/L   Potassium 4.0 3.5 - 5.1 mmol/L   Chloride 104 98 - 111 mmol/L   CO2 24 22 - 32 mmol/L   Glucose, Bld 94 70 - 99 mg/dL   BUN 10 6 - 20 mg/dL   Creatinine, Ser 5.28 0.61 - 1.24 mg/dL   Calcium 9.0 8.9 - 41.3 mg/dL   GFR, Estimated >24 >40 mL/min   Anion gap 11 5 - 15  CBC   Collection Time: 08/04/23  9:00 PM  Result Value Ref Range   WBC 6.9 4.0 - 10.5 K/uL   RBC 4.75 4.22 - 5.81 MIL/uL   Hemoglobin 15.6 13.0 - 17.0 g/dL   HCT  10.2 72.5 - 36.6 %   MCV 98.1 80.0 - 100.0 fL   MCH 32.8 26.0 - 34.0 pg   MCHC 33.5 30.0 - 36.0 g/dL   RDW 44.0 34.7 - 42.5 %   Platelets 302 150 - 400 K/uL   nRBC 0.0 0.0 -  0.2 %  Troponin I (High Sensitivity)   Collection Time: 08/04/23  9:00 PM  Result Value Ref Range   Troponin I (High Sensitivity) 49 (H) <18 ng/L  Troponin I (High Sensitivity)   Collection Time: 08/04/23 11:25 PM  Result Value Ref Range   Troponin I (High Sensitivity) 49 (H) <18 ng/L  ECHOCARDIOGRAM COMPLETE   Collection Time: 08/05/23  2:31 PM  Result Value Ref Range   Weight 3,520 oz   Height 74 in   BP 173/118 mmHg   S' Lateral 3.40 cm   AR max vel 3.05 cm2   AV Area VTI 3.38 cm2   AV Mean grad 5.0 mmHg   AV Peak grad 9.9 mmHg   Ao pk vel 1.57 m/s   Area-P 1/2 2.87 cm2   AV Area mean vel 3.15 cm2   Est EF 55 - 60%        Assessment & Plan:  Encounter to establish care  Primary hypertension Assessment & Plan: 150/90, 142/101 Uncontrolled. Continue current medications. Adding lisinopril 10mg  daily. Discussed DASH diet and dietary sodium restrictions.  Continue dietary efforts and physical activity. Will follow up in 6 weeks.   Orders: -     Lisinopril; Take 1 tablet (10 mg total) by mouth daily.  Dispense: 90 tablet; Refill: 1  Current moderate episode of major depressive disorder without prior episode [facility]) Assessment & Plan: Patient reporting depression symptoms since December. With consideration of decreased interest, decreased motivation, and social isolation, will start patient on Zoloft today. Patient counseled on side effects such as nausea and headache, she was advised to stop taking medication if she experiences suicidal ideation. We will follow up in 6 weeks.    Orders: -     Sertraline HCl; Take 1 tablet (50 mg total) by mouth daily.  Dispense: 30 tablet; Refill: 1    Return in about 6 weeks (around 09/22/2023) for blood pressure and depression  .   Toni Amend Neveah Bang,  PA-C

## 2023-08-27 ENCOUNTER — Encounter: Payer: Self-pay | Admitting: Physician Assistant

## 2023-08-30 ENCOUNTER — Ambulatory Visit (INDEPENDENT_AMBULATORY_CARE_PROVIDER_SITE_OTHER): Payer: Self-pay | Admitting: Physician Assistant

## 2023-08-30 ENCOUNTER — Encounter: Payer: Self-pay | Admitting: Physician Assistant

## 2023-08-30 VITALS — BP 110/79 | HR 85 | Temp 98.6°F | Ht 74.0 in | Wt 220.0 lb

## 2023-08-30 DIAGNOSIS — I10 Essential (primary) hypertension: Secondary | ICD-10-CM

## 2023-08-30 DIAGNOSIS — R61 Generalized hyperhidrosis: Secondary | ICD-10-CM | POA: Insufficient documentation

## 2023-08-30 DIAGNOSIS — I719 Aortic aneurysm of unspecified site, without rupture: Secondary | ICD-10-CM | POA: Insufficient documentation

## 2023-08-30 DIAGNOSIS — F321 Major depressive disorder, single episode, moderate: Secondary | ICD-10-CM

## 2023-08-30 DIAGNOSIS — Z1211 Encounter for screening for malignant neoplasm of colon: Secondary | ICD-10-CM

## 2023-08-30 MED ORDER — AMLODIPINE BESYLATE 5 MG PO TABS: 5.0000 mg | ORAL_TABLET | Freq: Every day | ORAL | 0 refills | Status: DC

## 2023-08-30 MED ORDER — LISINOPRIL 10 MG PO TABS: 10.0000 mg | ORAL_TABLET | Freq: Every day | ORAL | 1 refills | Status: AC

## 2023-08-30 MED ORDER — SERTRALINE HCL 100 MG PO TABS: 100.0000 mg | ORAL_TABLET | Freq: Every day | ORAL | 3 refills | Status: DC

## 2023-08-30 NOTE — Assessment & Plan Note (Signed)
 Minimal improvement on Zoloft  50mg , increasing to 100mg  daily. Patient counseled on side effects such as nausea and headache, she was advised to stop taking medication if she experiences suicidal ideation.  Follow up in approximately 4 weeks.

## 2023-08-30 NOTE — Assessment & Plan Note (Signed)
 118/72. Controlled. Normal orthostatic readings.  Continue current medications. Discontinue hydrochlorothiazide .  Discussed DASH diet and dietary sodium restrictions.  Increase dietary efforts and physical activity.

## 2023-08-30 NOTE — Assessment & Plan Note (Signed)
 Patient presents today with continued night sweats. He reports no worsening chest pain or shortness of breath. Will order thyroid labs today. PSA screening ordered today. Warning signs reviewed, patient to follow up in approximately 4 weeks or go to the ER for worsening symptoms.

## 2023-08-30 NOTE — Progress Notes (Signed)
 Acute Office Visit  Subjective:     Patient ID: Ricky Casey, male    DOB: 1961/01/14, 63 y.o.   MRN: [id number]   Patient presents today with concerns for elevated blood pressure readings at home and lightheadedness. He brings blood pressure log from home with readings ranging from 110/70-140/90. He reports headaches have improved but endorses continued lightheadedness, night sweats, and increase sleepiness. He reports little improvement in depression symptoms today.      Review of Systems  Constitutional:  Positive for diaphoresis and malaise/fatigue. Negative for chills and fever.  Respiratory:  Negative for cough and shortness of breath.   Cardiovascular:  Negative for chest pain.  Neurological:  Positive for dizziness and headaches.        Objective:     BP 110/79 Comment: standing  Pulse 85   Temp 98.6 F (37 C)   Ht 6\' 2"  (1.88 m)   Wt 220 lb (99.8 kg)   SpO2 97%   BMI 28.25 kg/m   Physical Exam Constitutional:      Appearance: Normal appearance.  HENT:     Head: Normocephalic.     Mouth/Throat:     Mouth: Mucous membranes are moist.     Pharynx: Oropharynx is clear.  Eyes:     Extraocular Movements: Extraocular movements intact.     Conjunctiva/sclera: Conjunctivae normal.  Cardiovascular:     Rate and Rhythm: Normal rate and regular rhythm.     Heart sounds: Normal heart sounds. No murmur heard. Pulmonary:     Effort: Pulmonary effort is normal.     Breath sounds: No wheezing, rhonchi or rales.  Skin:    General: Skin is warm and dry.  Neurological:     General: No focal deficit present.     Mental Status: He is alert and oriented to person, place, and time.  Psychiatric:        Mood and Affect: Mood normal.        Behavior: Behavior normal.     No results found for any visits on 08/30/23.      Assessment & Plan:  Primary hypertension Assessment & Plan: 118/72. Controlled. Normal orthostatic readings.  Continue current  medications. Discontinue hydrochlorothiazide .  Discussed DASH diet and dietary sodium restrictions.  Increase dietary efforts and physical activity.   Orders: -     amLODIPine  Besylate; Take 1 tablet (5 mg total) by mouth daily.  Dispense: 90 tablet; Refill: 0 -     Lisinopril ; Take 1 tablet (10 mg total) by mouth daily.  Dispense: 90 tablet; Refill: 1  Current moderate episode of major depressive disorder without prior episode [facility]) Assessment & Plan: Minimal improvement on Zoloft  50mg , increasing to 100mg  daily. Patient counseled on side effects such as nausea and headache, she was advised to stop taking medication if she experiences suicidal ideation.  Follow up in approximately 4 weeks.   Orders: -     Sertraline  HCl; Take 1 tablet (100 mg total) by mouth daily.  Dispense: 30 tablet; Refill: 3  Night sweats Assessment & Plan: Patient presents today with continued night sweats. He reports no worsening chest pain or shortness of breath. Will order thyroid labs today. PSA screening ordered today. Warning signs reviewed, patient to follow up in approximately 4 weeks or go to the ER for worsening symptoms.   Orders: -     TSH + free T4 -     PSA  Screen for colon cancer -     Ambulatory  referral to Gastroenterology    Return in 23 days (on 09/22/2023) for previously scheduled appt .  Jearlean Mince Jianna Drabik, PA-C

## 2023-08-31 ENCOUNTER — Encounter: Payer: Self-pay | Admitting: Physician Assistant

## 2023-08-31 DIAGNOSIS — I7121 Aneurysm of the ascending aorta, without rupture: Secondary | ICD-10-CM | POA: Insufficient documentation

## 2023-08-31 LAB — PSA: Prostate Specific Ag, Serum: 0.8 ng/mL (ref 0.0–4.0)

## 2023-08-31 LAB — TSH+FREE T4
Free T4: 0.81 ng/dL — ABNORMAL LOW (ref 0.82–1.77)
TSH: 1.29 u[IU]/mL (ref 0.450–4.500)

## 2023-09-09 ENCOUNTER — Other Ambulatory Visit: Payer: Self-pay | Admitting: Physician Assistant

## 2023-09-09 DIAGNOSIS — I10 Essential (primary) hypertension: Secondary | ICD-10-CM

## 2023-09-09 MED ORDER — SERTRALINE HCL 100 MG PO TABS: 100.0000 mg | ORAL_TABLET | Freq: Every day | ORAL | 3 refills | Status: AC

## 2023-09-15 ENCOUNTER — Encounter: Payer: Self-pay | Admitting: *Deleted

## 2023-09-15 ENCOUNTER — Encounter (HOSPITAL_COMMUNITY): Payer: Self-pay

## 2023-09-22 ENCOUNTER — Encounter: Payer: Self-pay | Admitting: Physician Assistant

## 2023-09-22 ENCOUNTER — Ambulatory Visit (INDEPENDENT_AMBULATORY_CARE_PROVIDER_SITE_OTHER): Payer: Self-pay | Admitting: Physician Assistant

## 2023-09-22 VITALS — BP 120/81 | Temp 98.8°F | Ht 74.0 in | Wt 211.4 lb

## 2023-09-22 DIAGNOSIS — R001 Bradycardia, unspecified: Secondary | ICD-10-CM | POA: Insufficient documentation

## 2023-09-22 DIAGNOSIS — I7121 Aneurysm of the ascending aorta, without rupture: Secondary | ICD-10-CM

## 2023-09-22 DIAGNOSIS — R400 Somnolence: Secondary | ICD-10-CM | POA: Insufficient documentation

## 2023-09-22 DIAGNOSIS — I10 Essential (primary) hypertension: Secondary | ICD-10-CM

## 2023-09-22 NOTE — Progress Notes (Signed)
 Established Patient Office Visit  Subjective   Patient ID: Ricky Casey, male    DOB: May 11, 1961  Age: 63 y.o. MRN: [id number]  No chief complaint on file.   Patient presents today for follow up regarding hypertension and depression. Currently on amlodipine  and lisinopril , and states daily compliance with medications. Patient denies headaches, blurry vision, dizziness, chest pain, shortness of breath, or palpitations. He reports improvement of dizziness following discontinuation of hydrochlorothiazide . He reports continued day time sleepiness, resulting in daily morning naps.        Review of Systems  Constitutional:  Positive for malaise/fatigue. Negative for fever and weight loss.  HENT:  Negative for ear pain and tinnitus.   Eyes:  Negative for blurred vision and double vision.  Respiratory:  Negative for shortness of breath.   Cardiovascular:  Negative for chest pain and palpitations.  Neurological:  Positive for tremors and headaches. Negative for tingling.      Objective:     BP 120/81   Temp 98.8 F (37.1 C)   Ht 6\' 2"  (1.88 m)   Wt 211 lb 6.4 oz (95.9 kg)   SpO2 98%   BMI 27.14 kg/m    Physical Exam Constitutional:      Appearance: Normal appearance.  HENT:     Head: Normocephalic.     Mouth/Throat:     Mouth: Mucous membranes are moist.     Pharynx: Oropharynx is clear.  Eyes:     Extraocular Movements: Extraocular movements intact.     Conjunctiva/sclera: Conjunctivae normal.  Cardiovascular:     Rate and Rhythm: Regular rhythm. Bradycardia present.     Heart sounds: Normal heart sounds. No murmur heard. Pulmonary:     Effort: Pulmonary effort is normal.     Breath sounds: Normal breath sounds.  Musculoskeletal:     Right lower leg: No edema.     Left lower leg: No edema.  Skin:    General: Skin is warm and dry.  Neurological:     General: No focal deficit present.     Mental Status: He is alert and oriented to person, place, and time.   Psychiatric:        Mood and Affect: Mood normal.        Behavior: Behavior normal.      No results found for any visits on 09/22/23.  The ASCVD Risk score (Arnett DK, et al., 2019) failed to calculate for the following reasons:   Cannot find a previous HDL lab   Cannot find a previous total cholesterol lab    Assessment & Plan:   Return in about 6 months (around 03/24/2024) for blood pressure .   Primary hypertension Assessment & Plan: 120/81 Controlled. Continue current medications. No change in management. Discussed DASH diet and dietary sodium restrictions.  Continue dietary efforts and physical activity.     Aneurysm of ascending aorta without rupture [facility]) Assessment & Plan: Stable. No chest pain or shortness of breath. Referral placed to vascular today for surveillance.   Orders: -     Ambulatory referral to Vascular Surgery  Daytime sleepiness Assessment & Plan: Patient presents today with concerns of day time sleepiness. He has had previous sleep study years ago indicating mild sleep apnea. He denies snoring or witnessed episodes of apnea. Considering his daytime sleeping and headaches with history of hypertension, will refer for sleep study for further workup of sleep apnea.  Orders: -     Ambulatory referral to Sleep Studies  Bradycardia  Assessment & Plan: Heart rate noted to be slightly bradycardic upon rooming. Auscultation of the heart revealed normal heart sounds, however rate still slightly brady at around 55. EKG obtained today revealed normal sinus rhythm with one PAC. Reassurance given today. Warning signs and return precautions discussed.   Orders: -     EKG 12-Lead    [employer], PA-C

## 2023-09-22 NOTE — Assessment & Plan Note (Signed)
 Patient presents today with concerns of day time sleepiness. He has had previous sleep study years ago indicating mild sleep apnea. He denies snoring or witnessed episodes of apnea. Considering his daytime sleeping and headaches with history of hypertension, will refer for sleep study for further workup of sleep apnea.

## 2023-09-22 NOTE — Assessment & Plan Note (Signed)
 120/81 Controlled. Continue current medications. No change in management. Discussed DASH diet and dietary sodium restrictions.  Continue dietary efforts and physical activity.

## 2023-09-22 NOTE — Assessment & Plan Note (Signed)
 Stable. No chest pain or shortness of breath. Referral placed to vascular today for surveillance.

## 2023-09-22 NOTE — Assessment & Plan Note (Signed)
 Heart rate noted to be slightly bradycardic upon rooming. Auscultation of the heart revealed normal heart sounds, however rate still slightly brady at around 55. EKG obtained today revealed normal sinus rhythm with one PAC. Reassurance given today. Warning signs and return precautions discussed.

## 2023-10-07 ENCOUNTER — Telehealth: Payer: Self-pay | Admitting: Gastroenterology

## 2023-10-07 NOTE — Telephone Encounter (Signed)
 Pt had return mail for the questionnaire we send him. Address is incorrect and mail was returned. Tried to call pt to get the correct address to sent this information to.

## 2023-10-18 ENCOUNTER — Encounter: Payer: Self-pay | Admitting: Internal Medicine

## 2023-10-18 ENCOUNTER — Ambulatory Visit: Payer: MEDICAID | Attending: Internal Medicine | Admitting: Internal Medicine

## 2023-10-18 VITALS — BP 132/80 | HR 68 | Ht 74.0 in | Wt 209.0 lb

## 2023-10-18 DIAGNOSIS — Z122 Encounter for screening for malignant neoplasm of respiratory organs: Secondary | ICD-10-CM

## 2023-10-18 DIAGNOSIS — E785 Hyperlipidemia, unspecified: Secondary | ICD-10-CM

## 2023-10-18 DIAGNOSIS — R634 Abnormal weight loss: Secondary | ICD-10-CM | POA: Insufficient documentation

## 2023-10-18 DIAGNOSIS — I77819 Aortic ectasia, unspecified site: Secondary | ICD-10-CM

## 2023-10-18 DIAGNOSIS — Z720 Tobacco use: Secondary | ICD-10-CM | POA: Insufficient documentation

## 2023-10-18 NOTE — Progress Notes (Signed)
 Cardiology Office Note  Date: 10/18/2023   ID: KENNEN STAMMER, DOB [birth year], MRN [id number]  PCP:  Grooms, Jearlean Mince, PA-C  Cardiologist:  None Electrophysiologist:  None   History of Present Illness: Ricky Casey is a 63 y.o. male known to have HTN, ascending aortic aneurysm 4.4 cm in 2023 is here to establish care.  Patient had ER visit in March 2025 for elevated blood pressure at home, 200 mmHg.  He was also having intermittent headaches and blurry vision.  Currently on amlodipine  5 mg once daily and lisinopril  10 mg once daily.  He was previously on HCTZ but that made him feel dizzy and hence discontinued.  He does have occasional headaches but not as bad as before.  Checked his blood pressure at home and it has been around 130/80 mmHg.  Smokes cigarettes, 1 pack/day.  He does have chest pains mainly at rest, musculoskeletal and does not have any chest pains with exertion.  Has stable SOB on exertion but no overt DOE, PND, orthopnea or leg swelling.  No exertional dizziness, syncope or palpitations.  Patient also reported unintentional weight loss of 40 pounds, sluggishness/fatigue and night sweats.  Past Medical History:  Diagnosis Date   Depression    Hypertension     Past Surgical History:  Procedure Laterality Date   APPENDECTOMY     BACK SURGERY     PATELLA FRACTURE SURGERY      Current Outpatient Medications  Medication Sig Dispense Refill   amLODipine  (NORVASC ) 5 MG tablet Take 1 tablet (5 mg total) by mouth daily. 90 tablet 0   aspirin EC 81 MG tablet Take 81 mg by mouth daily. Swallow whole.     lisinopril  (ZESTRIL ) 10 MG tablet Take 1 tablet (10 mg total) by mouth daily. 90 tablet 1   sertraline  (ZOLOFT ) 100 MG tablet Take 1 tablet (100 mg total) by mouth daily. 30 tablet 3   No current facility-administered medications for this visit.   Allergies:  Morphine and codeine   Social History: The patient  reports that he has been smoking cigarettes. He does  not have any smokeless tobacco history on file. He reports that he does not drink alcohol and does not use drugs.   Family History: The patient's family history is not on file.   ROS:  Please see the history of present illness. Otherwise, complete review of systems is positive for none  All other systems are reviewed and negative.   Physical Exam: VS:  Ht 6\' 2"  (1.88 m)   Wt 209 lb (94.8 kg)   BMI 26.83 kg/m , BMI Body mass index is 26.83 kg/m.  Wt Readings from Last 3 Encounters:  10/18/23 209 lb (94.8 kg)  09/22/23 211 lb 6.4 oz (95.9 kg)  08/30/23 220 lb (99.8 kg)    General: Patient appears comfortable at rest. HEENT: Conjunctiva and lids normal, oropharynx clear with moist mucosa. Neck: Supple, no elevated JVP or carotid bruits, no thyromegaly. Lungs: Clear to auscultation, nonlabored breathing at rest. Cardiac: Regular rate and rhythm, no S3 or significant systolic murmur, no pericardial rub. Abdomen: Soft, nontender, no hepatomegaly, bowel sounds present, no guarding or rebound. Extremities: No pitting edema, distal pulses 2+. Skin: Warm and dry. Musculoskeletal: No kyphosis. Neuropsychiatric: Alert and oriented x3, affect grossly appropriate.  Recent Labwork: 08/04/2023: BUN 10; Creatinine, Ser 1.11; Hemoglobin 15.6; Platelets 302; Potassium 4.0; Sodium 139 08/30/2023: TSH 1.290  No results found for: "CHOL", "TRIG", "HDL", "CHOLHDL", "VLDL", "LDLCALC", "LDLDIRECT"  Other Studies Reviewed Today: I reviewed the records on the chart.  Assessment and Plan:  Ascending aortic aneurysm 4.4 cm in 2023: Obtain MRA aorta.  Continue aspirin 81 mg once daily, obtain lipid panel.  Avoid fluoroquinolones.  Avoid strenuous activities (no lifting more than 50 pounds) and avoid contact sports.  Goal BP less than 130/80 mmHg.  Smoking cessation counseling provided.  HTN, controlled: Continue current antihypertensives, amlodipine  5 mg once daily, lisinopril  10 mg once daily.  Goal BP  less than 130/80 mmHg.  Check BP at home, in a.m. and p.m.  Nicotine abuse: Smokes 1 pack/day.  Smoking cessation counseling provided.  Unintentional weight loss of 40 pounds, night sweats, fatigue: Obtain CT chest lung cancer screening.  Follow with PCP for 8 cancer screening.      Medication Adjustments/Labs and Tests Ordered: Current medicines are reviewed at length with the patient today.  Concerns regarding medicines are outlined above.    Disposition:  Follow up 1 year  Signed Kaleiah Kutzer Priya Jlynn Langille, MD, 10/18/2023 1:21 PM    [facility] at [facility] [address] [place], [place], [place] [address]

## 2023-10-18 NOTE — Patient Instructions (Addendum)
 Medication Instructions:  Your physician recommends that you continue on your current medications as directed. Please refer to the Current Medication list given to you today.   Labwork: Fasting Lipid Panel to be completed at [facility] in [place] on tomorrow   Testing/Procedures: Non-Cardiac CT Angiography (CTA), is a special type of CT scan that uses a computer to produce multi-dimensional views of major blood vessels throughout the body. In CT angiography, a contrast material is injected through an IV to help visualize the blood vessels  Non-Cardiac CT scanning, (CAT scanning), is a noninvasive, special x-ray that produces cross-sectional images of the body using x-rays and a computer. CT scans help physicians diagnose and treat medical conditions. For some CT exams, a contrast material is used to enhance visibility in the area of the body being studied. CT scans provide greater clarity and reveal more details than regular x-ray exams.   Follow-Up: Your physician recommends that you schedule a follow-up appointment in: 1 year. You will receive a reminder call in about 8 months reminding you to schedule your appointment. If you don't receive this call, please contact our office.   Any Other Special Instructions Will Be Listed Below (If Applicable). Please avoid antibiotics that have Fluoroquinolones   Thank you for choosing [facility]!     If you need a refill on your cardiac medications before your next appointment, please call your pharmacy.

## 2023-10-19 ENCOUNTER — Telehealth: Payer: Self-pay | Admitting: Internal Medicine

## 2023-10-19 NOTE — Telephone Encounter (Signed)
 Checking percert on the following patient for testing scheduled at [facility].   Mr Ricky Casey Chest w/wo contrast  Low Dose CT  10/25/2023

## 2023-10-21 LAB — LIPID PANEL
Chol/HDL Ratio: 5.8 ratio — ABNORMAL HIGH (ref 0.0–5.0)
Cholesterol, Total: 187 mg/dL (ref 100–199)
HDL: 32 mg/dL — ABNORMAL LOW
LDL Chol Calc (NIH): 125 mg/dL — ABNORMAL HIGH (ref 0–99)
Triglycerides: 165 mg/dL — ABNORMAL HIGH (ref 0–149)
VLDL Cholesterol Cal: 30 mg/dL (ref 5–40)

## 2023-10-25 ENCOUNTER — Ambulatory Visit (HOSPITAL_COMMUNITY)
Admission: RE | Admit: 2023-10-25 | Discharge: 2023-10-25 | Disposition: A | Discharge status: Home / Self Care | Payer: Self-pay | Source: Ambulatory Visit | Attending: Internal Medicine | Admitting: Internal Medicine

## 2023-10-25 DIAGNOSIS — I77819 Aortic ectasia, unspecified site: Secondary | ICD-10-CM

## 2023-10-25 DIAGNOSIS — Z122 Encounter for screening for malignant neoplasm of respiratory organs: Secondary | ICD-10-CM | POA: Insufficient documentation

## 2023-10-25 MED ORDER — GADOBUTROL 1 MMOL/ML IV SOLN: 9.0000 mL | Freq: Once | INTRAVENOUS | Status: AC | PRN

## 2023-10-27 ENCOUNTER — Ambulatory Visit: Payer: Self-pay | Admitting: Internal Medicine

## 2023-10-27 DIAGNOSIS — I77819 Aortic ectasia, unspecified site: Secondary | ICD-10-CM

## 2023-10-27 NOTE — Telephone Encounter (Signed)
-----   Message from Vishnu P Mallipeddi sent at 10/27/2023 11:41 AM EDT ----- Ascending aortic aneurysm 4.7 cm (4.4 cm in 2023).  Place referral to CT surgery. ----- Message ----- From: Interface, Rad Results In Sent: 10/26/2023   3:35 PM EDT To: Vishnu P Mallipeddi, MD

## 2023-10-27 NOTE — Telephone Encounter (Signed)
 The patient has been notified of the result and verbalized understanding.  All questions (if any) were answered. Camilo Cella, [place] 10/27/2023 4:14 PM

## 2023-11-01 ENCOUNTER — Ambulatory Visit (INDEPENDENT_AMBULATORY_CARE_PROVIDER_SITE_OTHER): Payer: Self-pay | Admitting: Primary Care

## 2023-11-01 ENCOUNTER — Other Ambulatory Visit: Payer: Self-pay | Admitting: Physician Assistant

## 2023-11-01 ENCOUNTER — Encounter: Payer: Self-pay | Admitting: Primary Care

## 2023-11-01 VITALS — BP 135/87 | HR 66 | Ht 74.0 in | Wt 210.0 lb

## 2023-11-01 DIAGNOSIS — E785 Hyperlipidemia, unspecified: Secondary | ICD-10-CM | POA: Insufficient documentation

## 2023-11-01 DIAGNOSIS — Z8669 Personal history of other diseases of the nervous system and sense organs: Secondary | ICD-10-CM

## 2023-11-01 DIAGNOSIS — F17210 Nicotine dependence, cigarettes, uncomplicated: Secondary | ICD-10-CM

## 2023-11-01 DIAGNOSIS — G4719 Other hypersomnia: Secondary | ICD-10-CM

## 2023-11-01 NOTE — Patient Instructions (Addendum)
 -  DAYTIME SLEEPINESS: Daytime sleepiness is likely related to your mild sleep apnea, which causes fatigue and sleepiness during the day. We will order a home sleep study to evaluate your sleep apnea. If confirmed, we may start you on CPAP therapy to help alleviate your symptoms and prevent other health issues like heart problems and diabetes.  -MILD SLEEP APNEA: Mild sleep apnea is a condition where your breathing stops and starts during sleep. We will reassess the severity of your sleep apnea with a home sleep study. If confirmed, we may start you on CPAP therapy, which is a non-invasive treatment that can help prevent complications like pulmonary hypertension. We also discussed other treatment options such as an oral appliance or surgery.  -PULMONARY ARTERY DILATION: Pulmonary artery dilation is an enlargement of the pulmonary artery, which can be associated with pulmonary hypertension and sleep apnea. We will evaluate your sleep apnea with a home sleep study, and if confirmed, CPAP therapy may be recommended to reduce the risk of pulmonary hypertension.  -UNINTENTIONAL WEIGHT LOSS: You have experienced significant unintentional weight loss over the past three months. We will send a message to your primary care provider to refer you for a colonoscopy and discuss further workup to determine the cause of the weight loss.  -THORACIC AORTIC ANEURYSM: A thoracic aortic aneurysm is an enlargement of the aorta in your chest. You have a 4.7 cm aneurysm that was found on a recent CT scan. Please continue your follow-up with cardiothoracic surgery as planned to monitor this condition.  INSTRUCTIONS: 1. Complete the home sleep study as ordered to evaluate your sleep apnea. 2. Follow up with your primary care provider for a referral for a colonoscopy and further workup for your unintentional weight loss. 3. Continue your scheduled follow-up appointments with thoracic surgery to monitor your heart health and  thoracic aortic aneurysm.  Follow-up 3 months with Landry NP / OSA

## 2023-11-01 NOTE — Progress Notes (Signed)
 @Patient  ID: Ricky Casey, male    DOB: Mar 15, 1961, 63 y.o.   MRN: [id number]  No chief complaint on file.   Referring provider: Grooms, Courtney, PA-C  HPI: 63 year old male, current smoker. PMH significant for HTN, hyperlipidemia, bradycardia, ascending aortic aneurysm, daytime sleepiness.  11/01/2023 Discussed the use of AI scribe software for clinical note transcription with the patient, who gave verbal consent to proceed.  History of Present Illness   Ricky Casey is a 63 year old male with mild sleep apnea who presents with daytime sleepiness. He was referred by Charmaine Grooms for evaluation of daytime sleepiness.  He experiences significant daytime sleepiness, particularly in the morning, often falling asleep for about three hours after letting his dogs out and having a cup of coffee. His sleep routine includes going to bed around 10:30 to 11:00 PM, taking about 15 to 20 minutes to fall asleep, and waking up once or twice at night to use the bathroom. He typically starts his day around 6:00 to 7:00 AM and takes naps during the day. No snoring, waking up gasping or choking, chronic cough, or coughing up blood. No concern for narcolepsy or cataplexy. Epworth score 8.   He has a history of mild sleep apnea diagnosed over ten years ago, for which he was given a CPAP machine but has not used it due to the mild severity of his condition.  He reports a significant unintentional weight loss over the past three months, which has not been worked up yet. He has not noticed any blood in his stools and has not had a colonoscopy since he was 63 years old.  He currently smokes and had a recent CT scan as part of a lung cancer screening program, results are still pending. He does have a thoracic aortic aneurysm and a dilated pulmonary artery. He has been referred to cardiothoracic surgery.      Allergies  Allergen Reactions   Morphine And Codeine Other (See Comments)    Severe migraine     Immunization History  Administered Date(s) Administered   Tdap 09/13/2021    Past Medical History:  Diagnosis Date   Depression    Hypertension     Tobacco History: Social History   Tobacco Use  Smoking Status Every Day   Current packs/day: 1.00   Types: Cigarettes  Smokeless Tobacco Not on file   Ready to quit: Not Answered Counseling given: Not Answered   Outpatient Medications Prior to Visit  Medication Sig Dispense Refill   amLODipine  (NORVASC ) 5 MG tablet Take 1 tablet (5 mg total) by mouth daily. 90 tablet 0   aspirin EC 81 MG tablet Take 81 mg by mouth daily. Swallow whole.     lisinopril  (ZESTRIL ) 10 MG tablet Take 1 tablet (10 mg total) by mouth daily. 90 tablet 1   sertraline  (ZOLOFT ) 100 MG tablet Take 1 tablet (100 mg total) by mouth daily. 30 tablet 3   No facility-administered medications prior to visit.   Review of Systems  Review of Systems  Constitutional: Negative.   HENT: Negative.    Respiratory: Negative.      Physical Exam  There were no vitals taken for this visit. Physical Exam Constitutional:      General: He is not in acute distress.    Appearance: Normal appearance. He is not ill-appearing.  HENT:     Head: Normocephalic and atraumatic.     Mouth/Throat:     Mouth: Mucous membranes are moist.  Pharynx: Oropharynx is clear.   Cardiovascular:     Rate and Rhythm: Normal rate and regular rhythm.  Pulmonary:     Effort: Pulmonary effort is normal.     Breath sounds: Normal breath sounds.     Comments: CTA  Skin:    General: Skin is warm and dry.   Neurological:     General: No focal deficit present.     Mental Status: He is alert and oriented to person, place, and time. Mental status is at baseline.   Psychiatric:        Mood and Affect: Mood normal.        Behavior: Behavior normal.        Thought Content: Thought content normal.        Judgment: Judgment normal.      Lab Results:  CBC    Component Value  Date/Time   WBC 6.9 08/04/2023 2100   RBC 4.75 08/04/2023 2100   HGB 15.6 08/04/2023 2100   HCT 46.6 08/04/2023 2100   PLT 302 08/04/2023 2100   MCV 98.1 08/04/2023 2100   MCH 32.8 08/04/2023 2100   MCHC 33.5 08/04/2023 2100   RDW 13.2 08/04/2023 2100   LYMPHSABS 1.8 09/13/2021 1900   MONOABS 0.9 09/13/2021 1900   EOSABS 0.2 09/13/2021 1900   BASOSABS 0.1 09/13/2021 1900    BMET    Component Value Date/Time   NA 139 08/04/2023 2100   K 4.0 08/04/2023 2100   CL 104 08/04/2023 2100   CO2 24 08/04/2023 2100   GLUCOSE 94 08/04/2023 2100   BUN 10 08/04/2023 2100   CREATININE 1.11 08/04/2023 2100   CALCIUM 9.0 08/04/2023 2100   GFRNONAA >60 08/04/2023 2100   GFRAA  08/29/2009 1435    >60        The eGFR has been calculated using the MDRD equation. This calculation has not been validated in all clinical situations. eGFR's persistently <60 mL/min signify possible Chronic Kidney Disease.    BNP No results found for: BNP  ProBNP No results found for: PROBNP  Imaging: MR ANGIO CHEST W WO CONTRAST Result Date: 10/26/2023 CLINICAL DATA:  Aortic dilatation EXAM: MRA CHEST WITH OR WITHOUT CONTRAST TECHNIQUE: Angiographic images of the chest were obtained using MRA technique without and with intravenous contrast. CONTRAST:  9mL GADAVIST  GADOBUTROL  1 MMOL/ML IV SOLN COMPARISON:  CT of the chest abdomen pelvis performed Sep 13, 2021 FINDINGS: VASCULAR Aorta: Limited evaluation of the aortic root secondary to technique and artifact. Tubular ascending thoracic aorta: 47 mm x 46 mm using 3D measurement tools. Aortic arch: 36 x 32 mm Proximal descending thoracic aorta: 33 x 32 mm Distal descending thoracic aorta: 31 x 28 mm Heart: Enlarged heart, no pericardial effusion. Pulmonary Arteries: Main pulmonary artery is dilated measuring 35 mm. Other: 3 vessel aortic arch. NON-VASCULAR Spinal cord: Nothing significant. Brachial plexus: Nothing significant. Muscles and tendons: Nothing  significant. Bones: Nothing significant. Joints: Nothing significant. IMPRESSION: 1. Aneurysm of the tubular ascending thoracic aorta measuring 4.7 cm. Ascending thoracic aortic aneurysm. Recommend semi-annual imaging followup by CTA or MRA and referral to cardiothoracic surgery if not already obtained. This recommendation follows 2010 ACCF/AHA/AATS/ACR/ASA/SCA/SCAI/SIR/STS/SVM Guidelines for the Diagnosis and Management of Patients With Thoracic Aortic Disease. Circulation. 2010; 121: Z733-z630. Aortic aneurysm NOS (ICD10-I71.9) 2. Given technical limitations, if the patient can tolerate iodinated contrast agent, CT imaging may provide better imaging quality for the purposes of longitudinal follow-up. 3. Dilated main pulmonary artery which can be observed in the  setting of pulmonary arterial hypertension. Electronically Signed   By: Maude Naegeli M.D.   On: 10/26/2023 15:33     Assessment & Plan:   1. Hx of sleep apnea (Primary) - Home sleep test; Future  2. Excessive daytime sleepiness - Home sleep test; Future  Assessment and Plan    Daytime sleepiness Daytime sleepiness likely related to mild sleep apnea, characterized by fatigue and sleepiness in the morning, with episodes of falling asleep during the day. No nocturnal symptoms such as snoring or gasping reported. Weight loss is not a contributing factor due to normal BMI. - Order home sleep study to re-evaluate for sleep apnea - If sleep apnea is confirmed, consider trial of CPAP to alleviate daytime sleepiness and prevent comorbidities such as cardiac arrhythmia, stroke, pulmonary hypertension, and diabetes - Discuss alternative treatments such as oral appliance or ENT referral for surgical options, noting that CPAP is generally preferred  Unintentional weight loss Significant unintentional weight loss reported over the last three months. He is due for a colonoscopy, which may be related to the weight loss. - Send message to primary care  provider to refer for colonoscopy - Discuss with primary care provider about further workup for unintentional weight loss  Thoracic aortic aneurysm Thoracic aortic aneurysm measuring 4.7 cm identified on recent CT scan. Follow-up with cardiothoracic surgery is planned. - Continue follow-up with cardiothoracic surgery as scheduled   Almarie LELON Ferrari, NP 11/01/2023

## 2023-11-05 ENCOUNTER — Emergency Department (HOSPITAL_COMMUNITY): Payer: MEDICAID

## 2023-11-05 ENCOUNTER — Encounter (HOSPITAL_COMMUNITY): Payer: Self-pay

## 2023-11-05 ENCOUNTER — Observation Stay (HOSPITAL_COMMUNITY)
Admission: EM | Admit: 2023-11-05 | Discharge: 2023-11-06 | Disposition: A | Discharge status: Home / Self Care | Payer: MEDICAID | Attending: Internal Medicine | Admitting: Internal Medicine

## 2023-11-05 ENCOUNTER — Other Ambulatory Visit: Payer: Self-pay

## 2023-11-05 DIAGNOSIS — I7121 Aneurysm of the ascending aorta, without rupture: Secondary | ICD-10-CM | POA: Diagnosis present

## 2023-11-05 DIAGNOSIS — F17210 Nicotine dependence, cigarettes, uncomplicated: Secondary | ICD-10-CM | POA: Insufficient documentation

## 2023-11-05 DIAGNOSIS — Z720 Tobacco use: Secondary | ICD-10-CM | POA: Diagnosis present

## 2023-11-05 DIAGNOSIS — I10 Essential (primary) hypertension: Secondary | ICD-10-CM | POA: Diagnosis present

## 2023-11-05 DIAGNOSIS — Z79899 Other long term (current) drug therapy: Secondary | ICD-10-CM | POA: Insufficient documentation

## 2023-11-05 DIAGNOSIS — R0789 Other chest pain: Principal | ICD-10-CM

## 2023-11-05 DIAGNOSIS — Z7982 Long term (current) use of aspirin: Secondary | ICD-10-CM | POA: Insufficient documentation

## 2023-11-05 DIAGNOSIS — R079 Chest pain, unspecified: Principal | ICD-10-CM | POA: Diagnosis present

## 2023-11-05 DIAGNOSIS — I719 Aortic aneurysm of unspecified site, without rupture: Secondary | ICD-10-CM

## 2023-11-05 DIAGNOSIS — E876 Hypokalemia: Secondary | ICD-10-CM | POA: Insufficient documentation

## 2023-11-05 LAB — TROPONIN I (HIGH SENSITIVITY)
Troponin I (High Sensitivity): 15 ng/L (ref <18)
Troponin I (High Sensitivity): 14 ng/L (ref <18)
Troponin I (High Sensitivity): 14 ng/L (ref <18)

## 2023-11-05 LAB — BASIC METABOLIC PANEL WITH GFR
Anion gap: 12 (ref 5–15)
BUN: 12 mg/dL (ref 6–20)
CO2: 23 mmol/L (ref 22–32)
Calcium: 9.3 mg/dL (ref 8.9–10.3)
Chloride: 107 mmol/L (ref 98–111)
Creatinine, Ser: 1.26 mg/dL — ABNORMAL HIGH (ref 0.61–1.24)
GFR, Estimated: >60 mL/min (ref >60)
Glucose, Bld: 107 mg/dL — ABNORMAL HIGH (ref 70–99)
Potassium: 3.5 mmol/L (ref 3.5–5.1)
Sodium: 142 mmol/L (ref 135–145)

## 2023-11-05 LAB — CBC
HCT: 41.7 % (ref 39.0–52.0)
Hemoglobin: 14.3 g/dL (ref 13.0–17.0)
MCH: 33.8 pg (ref 26.0–34.0)
MCHC: 34.3 g/dL (ref 30.0–36.0)
MCV: 98.6 fL (ref 80.0–100.0)
Platelets: 278 10*3/uL (ref 150–400)
RBC: 4.23 MIL/uL (ref 4.22–5.81)
RDW: 13.9 % (ref 11.5–15.5)
WBC: 8.6 10*3/uL (ref 4.0–10.5)
nRBC: 0 % (ref 0.0–0.2)

## 2023-11-05 LAB — HEPATIC FUNCTION PANEL
ALT: 10 U/L (ref 0–44)
AST: 17 U/L (ref 15–41)
Albumin: 3.7 g/dL (ref 3.5–5.0)
Alkaline Phosphatase: 49 U/L (ref 38–126)
Bilirubin, Direct: <0.1 mg/dL (ref 0.0–0.2)
Total Bilirubin: 0.5 mg/dL (ref 0.0–1.2)
Total Protein: 6.9 g/dL (ref 6.5–8.1)

## 2023-11-05 MED ORDER — HYDROMORPHONE HCL 1 MG/ML IJ SOLN: 0.5000 mg | INTRAMUSCULAR | Status: DC | PRN

## 2023-11-05 MED ORDER — ENOXAPARIN SODIUM 40 MG/0.4ML IJ SOSY
40.0000 mg | PREFILLED_SYRINGE | INTRAMUSCULAR | Status: DC
Start: 2023-11-05 — End: 2023-11-06
  Administered 2023-11-05: 40 mg via SUBCUTANEOUS
  Filled 2023-11-05: qty 0.4

## 2023-11-05 MED ORDER — ASPIRIN 81 MG PO TBEC
81.0000 mg | DELAYED_RELEASE_TABLET | Freq: Every day | ORAL | Status: DC
  Administered 2023-11-06: 81 mg via ORAL
  Filled 2023-11-05: qty 1

## 2023-11-05 MED ORDER — ONDANSETRON HCL 4 MG PO TABS: 4.0000 mg | ORAL_TABLET | Freq: Four times a day (QID) | ORAL | Status: DC | PRN

## 2023-11-05 MED ORDER — IOHEXOL 350 MG/ML SOLN
100.0000 mL | Freq: Once | INTRAVENOUS | Status: AC | PRN
  Administered 2023-11-05: 100 mL via INTRAVENOUS

## 2023-11-05 MED ORDER — LISINOPRIL 10 MG PO TABS
10.0000 mg | ORAL_TABLET | Freq: Every day | ORAL | Status: DC
  Administered 2023-11-06: 10 mg via ORAL
  Filled 2023-11-05: qty 1

## 2023-11-05 MED ORDER — AMLODIPINE BESYLATE 5 MG PO TABS
5.0000 mg | ORAL_TABLET | Freq: Every day | ORAL | Status: DC
  Administered 2023-11-06: 5 mg via ORAL
  Filled 2023-11-05: qty 1

## 2023-11-05 MED ORDER — SERTRALINE HCL 50 MG PO TABS
100.0000 mg | ORAL_TABLET | Freq: Every day | ORAL | Status: DC
  Administered 2023-11-06: 100 mg via ORAL
  Filled 2023-11-05: qty 2

## 2023-11-05 MED ORDER — ONDANSETRON HCL 4 MG/2ML IJ SOLN
4.0000 mg | Freq: Four times a day (QID) | INTRAMUSCULAR | Status: DC | PRN
Start: 2023-11-05 — End: 2023-11-06

## 2023-11-05 NOTE — ED Notes (Signed)
 Sandwich and drink given to pt. Nad.

## 2023-11-05 NOTE — ED Triage Notes (Signed)
 Pt from home complains of CP, with pain radiating to left arm, endorse mild SOB, and headache. Pt also endorse left side neck pain, indigestion . Pt states pain started 230am this morning and woke him up from sleep. Pt AAOx4 and ambulatory.

## 2023-11-05 NOTE — H&P (Signed)
 History and Physical    Patient: Ricky Casey DOB: 08/17/1960 DOA: 11/05/2023 DOS: the patient was seen and examined on 11/05/2023 PCP: Grooms, Charmaine, PA-C  Patient coming from: Home  Chief Complaint:  Chief Complaint  Patient presents with   Chest Pain   HPI: Ricky Casey is a 63 y.o. male with medical history significant of aortic aneurysm, hypertension.  Patient seen for chest pain this morning that radiates down left arm and into neck.  It has been waxing and waning with a start time of around 2:30 AM.  Since arriving, the patient's pain has improved and had resolved by the time I arrived.  No nausea, vomiting, diaphoresis.  Review of Systems: As mentioned in the history of present illness. All other systems reviewed and are negative. Past Medical History:  Diagnosis Date   Depression    Hypertension    Past Surgical History:  Procedure Laterality Date   APPENDECTOMY     BACK SURGERY     PATELLA FRACTURE SURGERY     Social History:  reports that he has been smoking cigarettes. He does not have any smokeless tobacco history on file. He reports that he does not drink alcohol and does not use drugs.  Allergies  Allergen Reactions   Morphine And Codeine Other (See Comments)    Severe migraine    History reviewed. No pertinent family history.  Prior to Admission medications   Medication Sig Start Date End Date Taking? Authorizing Provider  amLODipine  (NORVASC ) 5 MG tablet Take 1 tablet (5 mg total) by mouth daily. 08/30/23 11/28/23 Yes Grooms, Courtney, PA-C  aspirin EC 81 MG tablet Take 81 mg by mouth at bedtime. Swallow whole.   Yes [provider]  Aspirin-Caffeine 845-65 MG PACK Take 1 Package by mouth every 6 (six) hours as needed (Pain).   Yes [provider]  lisinopril  (ZESTRIL ) 10 MG tablet Take 1 tablet (10 mg total) by mouth daily. 08/30/23  Yes Grooms, Courtney, PA-C  sertraline  (ZOLOFT ) 100 MG tablet Take 1 tablet (100 mg  total) by mouth daily. 09/09/23  Yes Grooms, [place], [place]    Physical Exam: Vitals:   11/05/23 1730 11/05/23 1800 11/05/23 1830 11/05/23 1922  BP: (!) 166/100 (!) 160/108 (!) 162/87 (!) 163/108  Pulse: (!) 51 (!) 55 64 (!) 56  Resp: 13 (!) 22  16  Temp:    98.2 F (36.8 C)  TempSrc:    Oral  SpO2: 97% 96% 97% 99%  Weight:    96.2 kg  Height:    6' 2 (1.88 m)   General: Middle-age male. Awake and alert and oriented x3. No acute cardiopulmonary distress.  HEENT: Normocephalic atraumatic.  Right and left ears normal in appearance.  Pupils equal, round, reactive to light. Extraocular muscles are intact. Sclerae anicteric and noninjected.  Moist mucosal membranes. No mucosal lesions.  Neck: Neck supple without lymphadenopathy. No carotid bruits. No masses palpated.  Cardiovascular: Regular rate with normal S1-S2 sounds. No murmurs, rubs, gallops auscultated. No JVD.  Respiratory: Good respiratory effort with no wheezes, rales, rhonchi. Lungs clear to auscultation bilaterally.  No accessory muscle use. Abdomen: Soft, nontender, nondistended. Active bowel sounds. No masses or hepatosplenomegaly  Skin: No rashes, lesions, or ulcerations.  Dry, warm to touch. 2+ dorsalis pedis and radial pulses. Musculoskeletal: No calf or leg pain. All major joints not erythematous nontender.  No upper or lower joint deformation.  Good ROM.  No contractures  Psychiatric: Intact judgment and insight. Pleasant  and cooperative. Neurologic: No focal neurological deficits. Strength is 5/5 and symmetric in upper and lower extremities.  Cranial nerves II through XII are grossly intact.   Data Reviewed: All labs and imaging reviewed by me  Assessment and Plan: No notes have been filed under this hospital service. Service: Hospitalist  Active Problems:   Hypertension   Ascending aortic aneurysm (HCC)   Nicotine abuse   Chest pain  Chest pain Serial troponins Echocardiogram in the morning Consult with Dr.  Alvan at [facility] for follow-up Ascending aortic aneurysm Stable by CT scan Hypertension Continue blood pressure control Nicotine abuse Patient declined patch    Advance Care Planning:   Code Status: Full Code   Consults: Cardiology  Family Communication: None  Severity of Illness: The appropriate patient status for this patient is OBSERVATION. Observation status is judged to be reasonable and necessary in order to provide the required intensity of service to ensure the patient's safety. The patient's presenting symptoms, physical exam findings, and initial radiographic and laboratory data in the context of their medical condition is felt to place them at decreased risk for further clinical deterioration. Furthermore, it is anticipated that the patient will be medically stable for discharge from the hospital within 2 midnights of admission.   Author: Janus Vlcek J Kimo Bancroft, DO 11/05/2023 8:35 PM  For on call review [email or web address].

## 2023-11-05 NOTE — Consult Note (Signed)
 Cardiology Consultation   Patient ID: Ricky Casey MRN: [id number]; DOB: 26-Jan-1961  Admit date: 11/05/2023 Date of Consult: 11/05/2023  PCP:  Ricky Pfeiffer, Ricky-C   [facility] Providers Cardiologist:  Ricky SHAUNNA Maywood, MD     Patient Profile: Ricky Casey is a 63 y.o. male with a hx of HTN and aortic aneurysm who is being seen 11/05/2023 for the evaluation of chest pain at the request of Ricky Casey.  History of Present Illness: Ricky Casey 63 yo male history of HTN, ascending aortic aneurysm, presents with chest pain. Chest pain woke him from sleep around 230AM. Sharp pain 7-8/10 in severity, left chest radiating into left shoulder, arm, and left side of neck. Constant pain essentially 12 hours. Not positional. Some shallow breathing but no specific SOB/DOE.     K 3.5 Cr 1.26 BUN 12 WBC 8.6 Hgb 14.3 Plt 278  Trop 15-->14 EKG SR, PACs, no acute ischemic changes CTA aorta: ascending thoracic aorta 41 mm  10/25/23 MRA: 4.7 cm ascending aneurysm   Past Medical History:  Diagnosis Date   Depression    Hypertension     Past Surgical History:  Procedure Laterality Date   APPENDECTOMY     BACK SURGERY     PATELLA FRACTURE SURGERY        Scheduled Meds:  Continuous Infusions:  PRN Meds:   Allergies:    Allergies  Allergen Reactions   Morphine And Codeine Other (See Comments)    Severe migraine    Social History:   Social History   Socioeconomic History   Marital status: Divorced    Spouse name: Not on file   Number of children: Not on file   Years of education: Not on file   Highest education level: Some college, no degree  Occupational History   Not on file  Tobacco Use   Smoking status: Every Day    Current packs/day: 1.00    Types: Cigarettes   Smokeless tobacco: Not on file  Substance and Sexual Activity   Alcohol use: No   Drug use: No   Sexual activity: Not on file  Other Topics Concern   Not on file  Social History  Narrative   Not on file   Social Drivers of Health   Financial Resource Strain: High Risk (08/11/2023)   Overall Financial Resource Strain (CARDIA)    Difficulty of Paying Living Expenses: Very hard  Food Insecurity: Food Insecurity Present (08/11/2023)   Hunger Vital Sign    Worried About Running Out of Food in the Last Year: Never true    Ran Out of Food in the Last Year: Sometimes true  Transportation Needs: No Transportation Needs (08/11/2023)   PRAPARE - Administrator, Civil Service (Medical): No    Lack of Transportation (Non-Medical): No  Physical Activity: Insufficiently Active (08/11/2023)   Exercise Vital Sign    Days of Exercise per Week: 5 days    Minutes of Exercise per Session: 20 min  Stress: Stress Concern Present (08/11/2023)   [employer] of Occupational Health - Occupational Stress Questionnaire    Feeling of Stress : To some extent  Social Connections: Moderately Isolated (08/11/2023)   Social Connection and Isolation Panel    Frequency of Communication with Friends and Family: More than three times a week    Frequency of Social Gatherings with Friends and Family: More than three times a week    Attends Religious Services: 1 to 4 times per year  Active Member of Clubs or Organizations: No    Attends Banker Meetings: Not on file    Marital Status: Divorced  Intimate Partner Violence: Not At Risk (08/05/2023)   Humiliation, Afraid, Rape, and Kick questionnaire    Fear of Current or Ex-Partner: No    Emotionally Abused: No    Physically Abused: No    Sexually Abused: No    Family History:   History reviewed. No pertinent family history.   ROS:  Please see the history of present illness.   All other ROS reviewed and negative.     Physical Exam/Data: Vitals:   11/05/23 1430 11/05/23 1530 11/05/23 1630 11/05/23 1635  BP: 130/87 (!) 140/101 (!) 140/90   Pulse: 62 (!) 51 (!) 53   Resp: 18 20 20    Temp:    98.7 F (37.1 C)   TempSrc:    Oral  SpO2: 96% 95% 96%   Weight:      Height:       No intake or output data in the 24 hours ending 11/05/23 1704    11/05/2023   12:39 PM 11/01/2023    1:48 PM 10/18/2023    1:17 PM  Last 3 Weights  Weight (lbs) 208 lb 210 lb 209 lb  Weight (kg) 94.348 kg 95.255 kg 94.802 kg     Body mass index is 26.71 kg/m.  General:  Well nourished, well developed, in no acute distress HEENT: normal Neck: no JVD Vascular: No carotid bruits; Distal pulses 2+ bilaterally Cardiac: RRR, 2/6 systolic murmur rusb Lungs:  clear to auscultation bilaterally, no wheezing, rhonchi or rales  Abd: soft, nontender, no hepatomegaly  Ext: no edema Musculoskeletal:  No deformities, BUE and BLE strength normal and equal Skin: warm and dry  Neuro:  CNs 2-12 intact, no focal abnormalities noted Psych:  Normal affect     Laboratory Data: High Sensitivity Troponin:   Recent Labs  Lab 11/05/23 1301 11/05/23 1424  TROPONINIHS 15 14     Chemistry Recent Labs  Lab 11/05/23 1301  NA 142  K 3.5  CL 107  CO2 23  GLUCOSE 107*  BUN 12  CREATININE 1.26*  CALCIUM 9.3  GFRNONAA >60  ANIONGAP 12    Recent Labs  Lab 11/05/23 1301  PROT 6.9  ALBUMIN 3.7  AST 17  ALT 10  ALKPHOS 49  BILITOT 0.5   Lipids No results for input(s): CHOL, TRIG, HDL, LABVLDL, LDLCALC, CHOLHDL in the last 168 hours.  Hematology Recent Labs  Lab 11/05/23 1301  WBC 8.6  RBC 4.23  HGB 14.3  HCT 41.7  MCV 98.6  MCH 33.8  MCHC 34.3  RDW 13.9  PLT 278   Thyroid No results for input(s): TSH, FREET4 in the last 168 hours.  BNPNo results for input(s): BNP, PROBNP in the last 168 hours.  DDimer No results for input(s): DDIMER in the last 168 hours.  Radiology/Studies:  CT Angio Chest Aorta W and/or Wo Contrast Result Date: 11/05/2023 CLINICAL DATA:  Chest pain.  Concern for thoracic aortic aneurysm. EXAM: CT ANGIOGRAPHY CHEST WITH CONTRAST TECHNIQUE: Multidetector CT imaging of the  chest was performed using the standard protocol during bolus administration of intravenous contrast. Multiplanar CT image reconstructions and MIPs were obtained to evaluate the vascular anatomy. RADIATION DOSE REDUCTION: This exam was performed according to the departmental dose-optimization program which includes automated exposure control, adjustment of the mA and/or kV according to patient size and/or use of iterative reconstruction technique. CONTRAST:  OMNIPAQUE  IOHEXOL  350 MG/ML SOLN COMPARISON:  None Available. FINDINGS: Cardiovascular: Aorta is normal in contour. No evidence of acute the descending thoracic aorta is ectatic and normal caliber. No pericardial effusion. Dissection or aneurysm. Ascending aorta measures 41 mm in diameter. (Coronal image 58/8 56/8. Sagittal image 105/9). Mediastinum/Nodes: No axillary or supraclavicular adenopathy. No mediastinal or hilar adenopathy. No pericardial fluid. Esophagus normal. Lungs/Pleura: Pleuroparenchymal thickening in the RIGHT lung base not changed from CT 10/25/2023. Posttraumatic change posterior RIGHT ribs. Upper Abdomen: Limited view of the liver, kidneys, pancreas are unremarkable. Normal adrenal glands. Musculoskeletal: Aggressive Review of the MIP images confirms the above findings. IMPRESSION: 1. Ascending thoracic aorta measures 41 mm in maximum diameter in coronal and sagittal plane. Recommend follow-up with low-dose lung cancer screening exam in 1 year's time as recommended on CT 10/25/2023. 2. Posttraumatic change in the posterior RIGHT chest wall. 3. Pleuroparenchymal thickening in the RIGHT lung base unchanged Electronically Signed   By: Jackquline Boxer M.D.   On: 11/05/2023 16:27     Assessment and Plan:  1.Chest pain - fairly atypical symptoms for ischemic chest pain, report symptoms lasted approx 12 hours constant - EKG and trops are benign - prior ER visit 07/2023 with atypical chest pain - history of aortic aneurysm. CTA in ER  was benign - would monitor overnight, echo in the AM. If benign  would plan for outpatient stress test. I am on call at [facility] over the weekend and can message me to arrange.   2. Aortic aneurysm - stable by CTA in ER, continue outpatient monitoring.   Please admit to medicine overnight at [facility]  For questions or updates, please contact [facility] Please consult [email or web address] for contact info under    Signed, Alvan Carrier, MD  11/05/2023 5:04 PM

## 2023-11-05 NOTE — ED Provider Notes (Signed)
 [place] EMERGENCY DEPARTMENT AT [facility] Provider Note   CSN: [id number] Arrival date & time: 11/05/23  1227     Patient presents with: Chest Pain   Ricky Casey is a 63 y.o. male.   Pt had chest pain this am.  Pain radiated from chest down left arm and into neck.  Pt reports waxing and waning of pain.  Pt had pain from 2:30 am.  Pt reports pain has almost resolved.  Pt reports pain has decreased since sitting in room.   The history is provided by the patient. No language interpreter was used.  Chest Pain      Prior to Admission medications   Medication Sig Start Date End Date Taking? Authorizing Provider  amLODipine  (NORVASC ) 5 MG tablet Take 1 tablet (5 mg total) by mouth daily. 08/30/23 11/28/23 Yes Grooms, Courtney, PA-C  aspirin EC 81 MG tablet Take 81 mg by mouth at bedtime. Swallow whole.   Yes [provider]  Aspirin-Caffeine 845-65 MG PACK Take 1 Package by mouth every 6 (six) hours as needed (Pain).   Yes [provider]  lisinopril  (ZESTRIL ) 10 MG tablet Take 1 tablet (10 mg total) by mouth daily. 08/30/23  Yes Grooms, Courtney, PA-C  sertraline  (ZOLOFT ) 100 MG tablet Take 1 tablet (100 mg total) by mouth daily. 09/09/23  Yes Grooms, [place], [place]    Allergies: Morphine and codeine    Review of Systems  Cardiovascular:  Positive for chest pain.  All other systems reviewed and are negative.   Updated Vital Signs BP 130/87   Pulse 62   Temp 99 F (37.2 C)   Resp 18   Ht 6' 2 (1.88 m)   Wt 94.3 kg   SpO2 96%   BMI 26.71 kg/m   Physical Exam Vitals and nursing note reviewed.  Constitutional:      Appearance: He is well-developed.  HENT:     Head: Normocephalic.   Cardiovascular:     Rate and Rhythm: Normal rate.     Heart sounds: Normal heart sounds.  Pulmonary:     Effort: Pulmonary effort is normal.     Breath sounds: Normal breath sounds.  Abdominal:     General: There is no distension.   Musculoskeletal:         General: Normal range of motion.     Cervical back: Normal range of motion.   Skin:    General: Skin is warm.   Neurological:     General: No focal deficit present.     Mental Status: He is alert and oriented to person, place, and time.     (all labs ordered are listed, but only abnormal results are displayed) Labs Reviewed  BASIC METABOLIC PANEL WITH GFR - Abnormal; Notable for the following components:      Result Value   Glucose, Bld 107 (*)    Creatinine, Ser 1.26 (*)    All other components within normal limits  CBC  HEPATIC FUNCTION PANEL  TROPONIN I (HIGH SENSITIVITY)  TROPONIN I (HIGH SENSITIVITY)    EKG: None  Radiology: No results found.   Procedures   Medications Ordered in the ED - No data to display                                  Medical Decision Making Pt complains of left sided chest pain.  Pt reports pain went into his  neck and down his left arm.    Amount and/or Complexity of Data Reviewed External Data Reviewed: notes.    Details: Notes from previous admission reviewed  Labs: ordered. Decision-making details documented in ED Course.    Details: Labs ordered reviewed and interpreted.  Troponin is negative x 2 Radiology: ordered.    Details: Chest xray no acute Ct angio  Thoracic ayers stable  ECG/medicine tests: ordered and independent interpretation performed. Decision-making details documented in ED Course.    Details: EKG no acute findings Discussion of management or test interpretation with external provider(s): I discussed the pt with Dr. Alvan Cardiology.  He advised obtaining ct to make sure thoracic AA is stable.  Medicine admission   Risk Prescription drug management. Decision regarding hospitalization.        Final diagnoses:  Chest pain, unspecified type    ED Discharge Orders     None          [place] Sonny MARLA DEVONNA 11/05/23 2034    Suzette Pac, MD 11/08/23 [phone]

## 2023-11-05 NOTE — ED Notes (Signed)
Dr Branch at bedside. ?

## 2023-11-06 ENCOUNTER — Observation Stay (HOSPITAL_BASED_OUTPATIENT_CLINIC_OR_DEPARTMENT_OTHER): Payer: MEDICAID

## 2023-11-06 DIAGNOSIS — Z720 Tobacco use: Secondary | ICD-10-CM

## 2023-11-06 DIAGNOSIS — I7121 Aneurysm of the ascending aorta, without rupture: Secondary | ICD-10-CM

## 2023-11-06 DIAGNOSIS — R079 Chest pain, unspecified: Secondary | ICD-10-CM

## 2023-11-06 LAB — CBC
HCT: 43.3 % (ref 39.0–52.0)
Hemoglobin: 14.3 g/dL (ref 13.0–17.0)
MCH: 32.6 pg (ref 26.0–34.0)
MCHC: 33 g/dL (ref 30.0–36.0)
MCV: 98.6 fL (ref 80.0–100.0)
Platelets: 254 10*3/uL (ref 150–400)
RBC: 4.39 MIL/uL (ref 4.22–5.81)
RDW: 13.9 % (ref 11.5–15.5)
WBC: 6.5 10*3/uL (ref 4.0–10.5)
nRBC: 0 % (ref 0.0–0.2)

## 2023-11-06 LAB — BASIC METABOLIC PANEL WITH GFR
Anion gap: 9 (ref 5–15)
BUN: 11 mg/dL (ref 6–20)
CO2: 26 mmol/L (ref 22–32)
Calcium: 8.8 mg/dL — ABNORMAL LOW (ref 8.9–10.3)
Chloride: 107 mmol/L (ref 98–111)
Creatinine, Ser: 0.87 mg/dL (ref 0.61–1.24)
GFR, Estimated: >60 mL/min (ref >60)
Glucose, Bld: 94 mg/dL (ref 70–99)
Potassium: 3.4 mmol/L — ABNORMAL LOW (ref 3.5–5.1)
Sodium: 142 mmol/L (ref 135–145)

## 2023-11-06 LAB — ECHOCARDIOGRAM COMPLETE
Area-P 1/2: 2.32 cm2
Calc EF: 62.5 %
Height: 74 in
S' Lateral: 3.2 cm
Single Plane A2C EF: 61.7 %
Single Plane A4C EF: 61.1 %
Weight: 3393.32 [oz_av]

## 2023-11-06 LAB — TROPONIN I (HIGH SENSITIVITY): Troponin I (High Sensitivity): 15 ng/L (ref <18)

## 2023-11-06 MED ORDER — POTASSIUM CHLORIDE CRYS ER 20 MEQ PO TBCR: 20.0000 meq | EXTENDED_RELEASE_TABLET | Freq: Once | ORAL | Status: DC

## 2023-11-06 MED ORDER — ACETAMINOPHEN 500 MG PO TABS
1000.0000 mg | ORAL_TABLET | Freq: Three times a day (TID) | ORAL | Status: DC
  Administered 2023-11-06 (×2): 1000 mg via ORAL
  Filled 2023-11-06 (×2): qty 2

## 2023-11-06 MED ORDER — ENSURE PLUS HIGH PROTEIN PO LIQD: 237.0000 mL | Freq: Two times a day (BID) | ORAL | Status: DC

## 2023-11-06 NOTE — Discharge Summary (Signed)
 Physician Discharge Summary   Patient: Ricky Casey MRN: [id number] DOB: 1960/09/28  Admit date:     11/05/2023  Discharge date: 11/06/23  Discharge Physician: Alm Henli Hey   PCP: Grooms, Charmaine, PA-C   Recommendations at discharge:  { Please follow up with primary care provider within 1-2 weeks  Please repeat BMP and CBC in one week    Hospital Course: 63 year old male with a history of hypertension, tobacco abuse, ascending aortic aneurysm presenting with left-sided chest pain radiated down his left arm.  He stated that this woke him up from sleep 11/05/2023.  He states that he has been having intermittent chest pains that been waking him up from slumber for the past several weeks.  Notably, review of the medical record shows that the patient has had a history of chest wall pain.  He was in the ED in March 2025 blood pressure and chest pain.  Echocardiogram 08/05/2023 showed EF 55 to 60%, no WMA, grade 1 DD. He has subsequently followed up with Dr. Mallipeddi he feels that his chest pains are likely musculoskeletal and noncardiac. Notably, the patient denies any exertional chest pain dizziness, syncope, palpitations.  He denies any fevers, chills, headache, neck pain. In the ED, the patient was afebrile and hemodynamically stable with oxygen saturation 96% on room air.  WBC 8.6, hemoglobin 14.3, platelets 278.  Sodium 142, potassium 3.4, bicarbonate 24, serum creatinine 0.87. Troponins 15>>14>>14>>15 EKG showed sinus rhythm with no ST-T wave changes. CTA chest showed ascending thoracic aneurysm measuring 41 mm.  There was normal thickening in the right base without change.  Assessment and Plan: Atypical chest pain -Mostly atypical by history - Troponins 15>14>14>>15 - CTA chest ascending thoracic aneurysm without concerning features - 08/05/2023 echo 35 to 60%, no WMA, grade 1 DD, normal RVF - 11/05/23 Echo--EF 60-65%, no WMA, normal RVF  Hypokalemia - Replete  Tobacco abuse -  Cessation discussed  Essential hypertension - Continue lisinopril  and amlodipine   Ascending thoracic aortic aneurysm - Patient has an appointment with thoracic surgery 11/30/2023       Consultants: none Procedures performed: none  Disposition: Home Diet recommendation:  Cardiac diet DISCHARGE MEDICATION: Allergies as of 11/06/2023       Reactions   Morphine And Codeine Other (See Comments)   Severe migraine        Medication List     TAKE these medications    amLODipine  5 MG tablet Commonly known as: NORVASC  Take 1 tablet (5 mg total) by mouth daily.   aspirin EC 81 MG tablet Take 81 mg by mouth at bedtime. Swallow whole.   Aspirin-Caffeine 845-65 MG Pack Take 1 Package by mouth every 6 (six) hours as needed (Pain).   lisinopril  10 MG tablet Commonly known as: ZESTRIL  Take 1 tablet (10 mg total) by mouth daily.   sertraline  100 MG tablet Commonly known as: ZOLOFT  Take 1 tablet (100 mg total) by mouth daily.        Discharge Exam: Filed Weights   11/05/23 1239 11/05/23 1922  Weight: 94.3 kg 96.2 kg   HEENT:  [place]/AT, No thrush, no icterus CV:  RRR, no rub, no S3, no S4 Lung:  CTA, no wheeze, no rhonchi Abd:  soft/+BS, NT Ext:  No edema, no lymphangitis, no synovitis, no rash   Condition at discharge: stable  The results of significant diagnostics from this hospitalization (including imaging, microbiology, ancillary and laboratory) are listed below for reference.   Imaging Studies: CT Angio Chest Aorta W and/or  Wo Contrast Result Date: 11/05/2023 CLINICAL DATA:  Chest pain.  Concern for thoracic aortic aneurysm. EXAM: CT ANGIOGRAPHY CHEST WITH CONTRAST TECHNIQUE: Multidetector CT imaging of the chest was performed using the standard protocol during bolus administration of intravenous contrast. Multiplanar CT image reconstructions and MIPs were obtained to evaluate the vascular anatomy. RADIATION DOSE REDUCTION: This exam was performed according to the  departmental dose-optimization program which includes automated exposure control, adjustment of the mA and/or kV according to patient size and/or use of iterative reconstruction technique. CONTRAST:  OMNIPAQUE  IOHEXOL  350 MG/ML SOLN COMPARISON:  None Available. FINDINGS: Cardiovascular: Aorta is normal in contour. No evidence of acute the descending thoracic aorta is ectatic and normal caliber. No pericardial effusion. Dissection or aneurysm. Ascending aorta measures 41 mm in diameter. (Coronal image 58/8 56/8. Sagittal image 105/9). Mediastinum/Nodes: No axillary or supraclavicular adenopathy. No mediastinal or hilar adenopathy. No pericardial fluid. Esophagus normal. Lungs/Pleura: Pleuroparenchymal thickening in the RIGHT lung base not changed from CT 10/25/2023. Posttraumatic change posterior RIGHT ribs. Upper Abdomen: Limited view of the liver, kidneys, pancreas are unremarkable. Normal adrenal glands. Musculoskeletal: Aggressive Review of the MIP images confirms the above findings. IMPRESSION: 1. Ascending thoracic aorta measures 41 mm in maximum diameter in coronal and sagittal plane. Recommend follow-up with low-dose lung cancer screening exam in 1 year's time as recommended on CT 10/25/2023. 2. Posttraumatic change in the posterior RIGHT chest wall. 3. Pleuroparenchymal thickening in the RIGHT lung base unchanged Electronically Signed   By: Jackquline Boxer M.D.   On: 11/05/2023 16:27   CT CHEST LUNG CA SCREEN LOW DOSE W/O CM Result Date: 11/05/2023 CLINICAL DATA:  63 year old male current smoker with 46 pack-year smoking history EXAM: CT CHEST WITHOUT CONTRAST LOW-DOSE FOR LUNG CANCER SCREENING TECHNIQUE: Multidetector CT imaging of the chest was performed following the standard protocol without IV contrast. RADIATION DOSE REDUCTION: This exam was performed according to the departmental dose-optimization program which includes automated exposure control, adjustment of the mA and/or kV according to  patient size and/or use of iterative reconstruction technique. COMPARISON:  09/13/2021 chest CT FINDINGS: Cardiovascular: Normal heart size. No significant pericardial effusion/thickening. Left anterior descending coronary atherosclerosis. Atherosclerotic thoracic aorta with dilated 4.4 cm ascending thoracic aorta. Dilated main pulmonary artery (3.6 cm diameter). Mediastinum/Nodes: No significant thyroid nodules. Unremarkable esophagus. No pathologically enlarged axillary, mediastinal or hilar lymph nodes, noting limited sensitivity for the detection of hilar adenopathy on this noncontrast study. Lungs/Pleura: No pneumothorax. No pleural effusion. Mild centrilobular emphysema with diffuse bronchial wall thickening. No acute consolidative airspace disease or lung masses. Nodular subpleural 17.2 mm focus of scarring in the peripheral basilar right lower lobe on series 4/image 218 is stable from 09/13/2021 CT. No additional significant pulmonary nodules. Upper abdomen: No acute abnormality. Musculoskeletal: No aggressive appearing focal osseous lesions. Mild thoracic spondylosis. Chronic healed and ununited posterior mid to lower right rib deformities. IMPRESSION: 1. Lung-RADS 2, benign appearance or behavior. Continue annual screening with low-dose chest CT without contrast in 12 months. 2. Dilated 4.4 cm ascending thoracic aorta, which can be reassessed on follow-up screening chest CT in 12 months. 3. One vessel coronary atherosclerosis. 4. Dilated main pulmonary artery, suggesting pulmonary arterial hypertension. 5. Aortic Atherosclerosis (ICD10-I70.0) and Emphysema (ICD10-J43.9). Electronically Signed   By: Selinda DELENA Blue M.D.   On: 11/05/2023 15:20   MR ANGIO CHEST W WO CONTRAST Result Date: 10/26/2023 CLINICAL DATA:  Aortic dilatation EXAM: MRA CHEST WITH OR WITHOUT CONTRAST TECHNIQUE: Angiographic images of the chest were obtained using MRA  technique without and with intravenous contrast. CONTRAST:  9mL  GADAVIST  GADOBUTROL  1 MMOL/ML IV SOLN COMPARISON:  CT of the chest abdomen pelvis performed Sep 13, 2021 FINDINGS: VASCULAR Aorta: Limited evaluation of the aortic root secondary to technique and artifact. Tubular ascending thoracic aorta: 47 mm x 46 mm using 3D measurement tools. Aortic arch: 36 x 32 mm Proximal descending thoracic aorta: 33 x 32 mm Distal descending thoracic aorta: 31 x 28 mm Heart: Enlarged heart, no pericardial effusion. Pulmonary Arteries: Main pulmonary artery is dilated measuring 35 mm. Other: 3 vessel aortic arch. NON-VASCULAR Spinal cord: Nothing significant. Brachial plexus: Nothing significant. Muscles and tendons: Nothing significant. Bones: Nothing significant. Joints: Nothing significant. IMPRESSION: 1. Aneurysm of the tubular ascending thoracic aorta measuring 4.7 cm. Ascending thoracic aortic aneurysm. Recommend semi-annual imaging followup by CTA or MRA and referral to cardiothoracic surgery if not already obtained. This recommendation follows 2010 ACCF/AHA/AATS/ACR/ASA/SCA/SCAI/SIR/STS/SVM Guidelines for the Diagnosis and Management of Patients With Thoracic Aortic Disease. Circulation. 2010; 121: Z733-z630. Aortic aneurysm NOS (ICD10-I71.9) 2. Given technical limitations, if the patient can tolerate iodinated contrast agent, CT imaging may provide better imaging quality for the purposes of longitudinal follow-up. 3. Dilated main pulmonary artery which can be observed in the setting of pulmonary arterial hypertension. Electronically Signed   By: Maude Naegeli M.D.   On: 10/26/2023 15:33    Microbiology: No results found for this or any previous visit.  Labs: CBC: Recent Labs  Lab 11/05/23 1301 11/06/23 0334  WBC 8.6 6.5  HGB 14.3 14.3  HCT 41.7 43.3  MCV 98.6 98.6  PLT 278 254   Basic Metabolic Panel: Recent Labs  Lab 11/05/23 1301 11/06/23 0334  NA 142 142  K 3.5 3.4*  CL 107 107  CO2 23 26  GLUCOSE 107* 94  BUN 12 11  CREATININE 1.26* 0.87  CALCIUM 9.3  8.8*   Liver Function Tests: Recent Labs  Lab 11/05/23 1301  AST 17  ALT 10  ALKPHOS 49  BILITOT 0.5  PROT 6.9  ALBUMIN 3.7   CBG: No results for input(s): GLUCAP in the last 168 hours.  Discharge time spent: greater than 30 minutes.  Signed: Alm Schneider, MD [facility] 11/06/2023

## 2023-11-06 NOTE — Progress Notes (Signed)
 Pt discharged ambulatory per his request accompanied by staff.

## 2023-11-06 NOTE — Hospital Course (Signed)
 63 year old male with a history of hypertension, tobacco abuse, ascending aortic aneurysm presenting with left-sided chest pain radiated down his left arm.  He stated that this woke him up from sleep 11/05/2023.  He states that he has been having intermittent chest pains that been waking him up from slumber for the past several weeks.  Notably, review of the medical record shows that the patient has had a history of chest wall pain.  He was in the ED in March 2025 blood pressure and chest pain.  Echocardiogram 08/05/2023 showed EF 55 to 60%, no WMA, grade 1 DD. He has subsequently followed up with Dr. Mallipeddi he feels that his chest pains are likely musculoskeletal and noncardiac. Notably, the patient denies any exertional chest pain dizziness, syncope, palpitations.  He denies any fevers, chills, headache, neck pain. In the ED, the patient was afebrile and hemodynamically stable with oxygen saturation 96% on room air.  WBC 8.6, hemoglobin 14.3, platelets 278.  Sodium 142, potassium 3.4, bicarbonate 24, serum creatinine 0.87. Troponins 15>>14>>14>>15 EKG showed sinus rhythm with no ST-T wave changes. CTA chest showed ascending thoracic aneurysm measuring 41 mm.  There was normal thickening in the right base without change.

## 2023-11-06 NOTE — Progress Notes (Signed)
  Echocardiogram 2D Echocardiogram has been performed.  Devora Ellouise SAUNDERS 11/06/2023, 2:08 PM

## 2023-11-08 ENCOUNTER — Telehealth: Payer: Self-pay

## 2023-11-08 NOTE — Transitions of Care (Post Inpatient/ED Visit) (Signed)
   11/08/2023  Name: Ricky Casey MRN: [id number] DOB: 07/04/1960  Today's TOC FU Call Status: Today's TOC FU Call Status:: Unsuccessful Call (1st Attempt) Unsuccessful Call (1st Attempt) Date: 11/08/23  Attempted to reach the patient regarding the most recent Inpatient/ED visit.  Follow Up Plan: Additional outreach attempts will be made to reach the patient to complete the Transitions of Care (Post Inpatient/ED visit) call.   Signature  Charmaine Bloodgood, LPN [facility] Health Advisor [facility] You Are. We Are. [facility] Direct Dial [phone]

## 2023-11-25 NOTE — Progress Notes (Deleted)
 [address]               [place], [place] [address]                      [phone]  PCP is Grooms, [place], [place] Referring Provider is Grooms, [place], [place]  Chief Complaint: Ascending thoracic aortic aneurysm   HPI: This is a 63 year old male with a past medical history hypertension,    Past Medical History:  Diagnosis Date   Depression    Hypertension     Past Surgical History:  Procedure Laterality Date   APPENDECTOMY     BACK SURGERY     PATELLA FRACTURE SURGERY     Family History: No family history on file.  Social History Social History   Tobacco Use   Smoking status: Every Day    Current packs/day: 1.00    Types: Cigarettes  Substance Use Topics   Alcohol use: No   Drug use: No    Current Outpatient Medications  Medication Sig Dispense Refill   amLODipine  (NORVASC ) 5 MG tablet Take 1 tablet (5 mg total) by mouth daily. 90 tablet 0   aspirin  EC 81 MG tablet Take 81 mg by mouth at bedtime. Swallow whole.     Aspirin -Caffeine 845-65 MG PACK Take 1 Package by mouth every 6 (six) hours as needed (Pain).     lisinopril  (ZESTRIL ) 10 MG tablet Take 1 tablet (10 mg total) by mouth daily. 90 tablet 1   sertraline  (ZOLOFT ) 100 MG tablet Take 1 tablet (100 mg total) by mouth daily. 30 tablet 3    Allergies  Allergen Reactions   Morphine And Codeine Other (See Comments)    Severe migraine    Review of Systems  Chest Pain [  ] Resting SOB [ ]  Exertional SOB [  ]  Pedal Edema [  ] Syncope [  ] Presyncope [  ]  General Review of Systems: [Y] = yes [ N]=no  Consitutional:   nausea [ ] ;  fever [ ] ;  Eye : blurred vision [ ] ; Amaurosis fugax[  ];  Resp: cough [ ] ;  hemoptysis[ ] ;  GI: vomiting[ ] ; melena[ ] ; hematochezia [] ;  HL:yzfjulmpj$[id number]$$MzfnczAzqnmzIZPI_SCqHXCoINXdHvIEBbkkMbGamJeJUBnAT$ ; Musculoskeletal: myalgias[ ] ; joint swelling[  ]; joint erythema[ ] ;  Heme/Lymph: anemia[ ] ;  Neuro: TIA[ ] ;stroke[ ] ;  seizures[ ] ;  Endocrine: diabetes[ ] ;   Vital  Signs:   Physical Exam: CV- Neck- Pulmonary- Abdomen- Extremities- Neurologic-  Diagnostic Tests:   Risk Modification in those with ascending thoracic aortic aneurysm:  Continue good control of blood pressure (prefer SBP 130/80 or less)-continue Lisinopril  (Zestril ) and Amlodipine  (Norvasc )  2. Avoid fluoroquinolone antibiotics (I.e Ciprofloxacin, Avelox, Levofloxacin, Ofloxacin)  3.  Use of statin (to decrease cardiovascular risk)-Lipid Profile done June 2025: Total cholesterol 187, Triglycerides 165, HDL 32, and LDL 125. Will defer to PCP/cardiology if/when to initiate  4.  Exercise and activity limitations is individualized, but in general, contact sports are to be  avoided and one should avoid heavy lifting (defined as half of ideal body weight) and exercises involving sustained Valsalva maneuver.  5. Counseling for those suspected of having genetically mediated disease. First-degree relatives of those with TAA disease should be screened as well as those who have a connective tissue disease (I.e with Marfan syndrome, Ehlers-Danlos syndrome,  and Loeys-Dietz syndrome) or a  bicuspid aortic valve,have an increased risk  for  complications related to TAA. He denies family history of TAA and personal history of connective tissue disorder.  Echocardiogram done June 2025 showed LVEF 60-65%, aortic valve is tricuspid, no aortic regurgitation or stenosis,  moderate dilatation of the ascending aorta, measuring 45 mm, and there is mild dilatation of the aortic root, measuring 40 mm.   6. If one has tobacco abuse, smoking cessation is highly encouraged.   Impression and Plan: CTA showed a *** cm ascending aortic aneurysm.  Echocardiogram shows a tri cuspid aortic valve without evidence of aortic regurgitation.  We discussed the natural history and and risk factors for growth of ascending aortic aneurysms.  We covered the importance of smoking cessation, tight blood pressure control,  refraining from lifting heavy objects, and avoiding fluoroquinolones.  The patient is aware of signs and symptoms of aortic dissection and when to present to the emergency department.  We will continue surveillance and a repeat CTA was ordered for 6 months.   Kyla CHRISTELLA Donald, PA-C [facility] [phone]

## 2023-11-27 ENCOUNTER — Other Ambulatory Visit: Payer: Self-pay | Admitting: Physician Assistant

## 2023-11-27 DIAGNOSIS — I10 Essential (primary) hypertension: Secondary | ICD-10-CM

## 2023-11-29 ENCOUNTER — Other Ambulatory Visit: Payer: Self-pay

## 2023-11-29 DIAGNOSIS — I10 Essential (primary) hypertension: Secondary | ICD-10-CM

## 2023-11-29 MED ORDER — AMLODIPINE BESYLATE 5 MG PO TABS: 5.0000 mg | ORAL_TABLET | Freq: Every day | ORAL | 2 refills | Status: DC

## 2023-11-29 MED ORDER — AMLODIPINE BESYLATE 5 MG PO TABS: 5.0000 mg | ORAL_TABLET | Freq: Every day | ORAL | 2 refills | Status: AC

## 2023-11-29 NOTE — Progress Notes (Signed)
 [address] [place] [address]             [phone]            Ricky Casey [id number] [birth year]   History of Present Illness:  [name] is a 63 year old male with medical history of hypertension and hyperlipidemia presents today to the clinic for newly identified ascending thoracic aortic aneurysm.  He has had multiple scans done recently.  He had CTA of chest done on 11/05/2023 which measured aneurysm at 4.1 cm.  Low dose CT scan for lung cancer screening measured aneurysm at 4.4 cm. He also had MR angio chest without contrast on 10/25/2023 which measured aneurysm at 4.7 cm. His blood pressure is well controlled with current medical therapy of lisinopril  and amlodipine .  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  His job is very physically demanding but he does not lift things over 100 pounds most days.  He is a current user of tobacco and smoker roughly one pack per day.  He is attempting to quit at this time.  He denies chest pain and shortness of breath.       Current Outpatient Medications on File Prior to Visit  Medication Sig Dispense Refill   amLODipine  (NORVASC ) 5 MG tablet Take 1 tablet (5 mg total) by mouth daily. 90 tablet 2   aspirin  EC 81 MG tablet Take 81 mg by mouth at bedtime. Swallow whole.     Aspirin -Caffeine 845-65 MG PACK Take 1 Package by mouth every 6 (six) hours as needed (Pain).     lisinopril  (ZESTRIL ) 10 MG tablet Take 1 tablet (10 mg total) by mouth daily. 90 tablet 1   sertraline  (ZOLOFT ) 100 MG tablet Take 1 tablet (100 mg total) by mouth daily. 30 tablet 3   No current facility-administered medications on file prior to visit.     ROS: Review of Systems  Respiratory: Negative.  Negative for shortness of breath.   Cardiovascular: Negative.  Negative for chest pain.    BP 116/76 (BP Location: Left Arm, Patient Position: Sitting, Cuff Size: Normal)   Pulse 65   Resp  20   Ht 6' 2 (1.88 m)   Wt 212 lb 11.2 oz (96.5 kg)   SpO2 94% Comment: RA  BMI 27.31 kg/m   Physical Exam Constitutional:      Appearance: Normal appearance.  HENT:     Head: Normocephalic and atraumatic.  Cardiovascular:     Rate and Rhythm: Normal rate and regular rhythm.     Heart sounds: Normal heart sounds, S1 normal and S2 normal.  Pulmonary:     Effort: Pulmonary effort is normal.     Breath sounds: Normal breath sounds.  Skin:    General: Skin is warm and dry.  Neurological:     General: No focal deficit present.     Mental Status: He is alert and oriented to person, place, and time.       Imaging: CLINICAL DATA:  Chest pain.  Concern for thoracic aortic aneurysm.   EXAM: CT ANGIOGRAPHY CHEST WITH CONTRAST   TECHNIQUE: Multidetector CT imaging of the chest was performed using the standard protocol during bolus administration of intravenous contrast. Multiplanar CT image reconstructions and MIPs were obtained to evaluate the vascular anatomy.   RADIATION DOSE REDUCTION: This exam was performed according to the departmental dose-optimization program  which includes automated exposure control, adjustment of the mA and/or kV according to patient size and/or use of iterative reconstruction technique.   CONTRAST:  OMNIPAQUE  IOHEXOL  350 MG/ML SOLN   COMPARISON:  None Available.   FINDINGS: Cardiovascular: Aorta is normal in contour. No evidence of acute the descending thoracic aorta is ectatic and normal caliber. No pericardial effusion. Dissection or aneurysm. Ascending aorta measures 41 mm in diameter. (Coronal image 58/8 56/8. Sagittal image 105/9).   Mediastinum/Nodes: No axillary or supraclavicular adenopathy. No mediastinal or hilar adenopathy. No pericardial fluid. Esophagus normal.   Lungs/Pleura: Pleuroparenchymal thickening in the RIGHT lung base not changed from CT 10/25/2023. Posttraumatic change posterior RIGHT ribs.   Upper Abdomen:  Limited view of the liver, kidneys, pancreas are unremarkable. Normal adrenal glands.   Musculoskeletal: Aggressive   Review of the MIP images confirms the above findings.   IMPRESSION: 1. Ascending thoracic aorta measures 41 mm in maximum diameter in coronal and sagittal plane. Recommend follow-up with low-dose lung cancer screening exam in 1 year's time as recommended on CT 10/25/2023. 2. Posttraumatic change in the posterior RIGHT chest wall. 3. Pleuroparenchymal thickening in the RIGHT lung base unchanged     Electronically Signed   By: Jackquline Boxer M.D.   On: 11/05/2023 16:27    A/P: Aneurysm of ascending aorta without rupture (HCC) -with multiple images from the same month measuring aneurysm at different sizes; recommend follow up in 6 months.  Size measured of ascending thoracic aortic aneurysm range from 4.1 cm - 4.7 cm.  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 We discussed the natural history and and risk factors for growth of ascending aortic aneurysms. Discussed recommendations to minimize the risk of further expansion or dissection including careful blood pressure control, avoidance of contact sports and heavy lifting (defined as over half of body weight), attention to lipid management.  We covered the importance of smoking cessation and discussed ways to quit. The patient does not yet meet surgical criteria of >5.5cm. The patient is aware of signs and symptoms of aortic dissection and when to present to the emergency department    Follow up in 6 months for CTA of chest for continued surveillance   Risk Modification:  Statin:  not currently on statin  Smoking cessation instruction/counseling given:  counseled patient on the dangers of tobacco use, advised patient to stop smoking, and reviewed strategies to maximize success  Patient was counseled on importance of Blood Pressure Control  They are instructed  to contact their Primary Care Physician if they start to have blood pressure readings over 130s/90s. Do not ever stop blood pressure medications on your own, unless instructed by healthcare professional.  Please avoid use of Fluoroquinolones as this can potentially increase your risk of Aortic Rupture and/or Dissection  Patient educated on signs and symptoms of Aortic Dissection, handout also provided in AVS  Manuelita CHRISTELLA Rough, PA-C 11/30/23

## 2023-11-30 ENCOUNTER — Ambulatory Visit: Payer: MEDICAID

## 2023-11-30 VITALS — BP 116/76 | HR 65 | Resp 20 | Ht 74.0 in | Wt 212.7 lb

## 2023-11-30 DIAGNOSIS — I7121 Aneurysm of the ascending aorta, without rupture: Secondary | ICD-10-CM

## 2023-11-30 NOTE — Patient Instructions (Signed)

## 2023-12-07 IMAGING — DX DG CHEST 2V
2 series · 2 of 2 positions shown · non-contrast
Comparison: Earlier CT dated 09/13/2021.

CLINICAL DATA: Trauma with right-sided pneumothorax seen on earlier
CT.

EXAM:
CHEST - 2 VIEW

[chest lat]
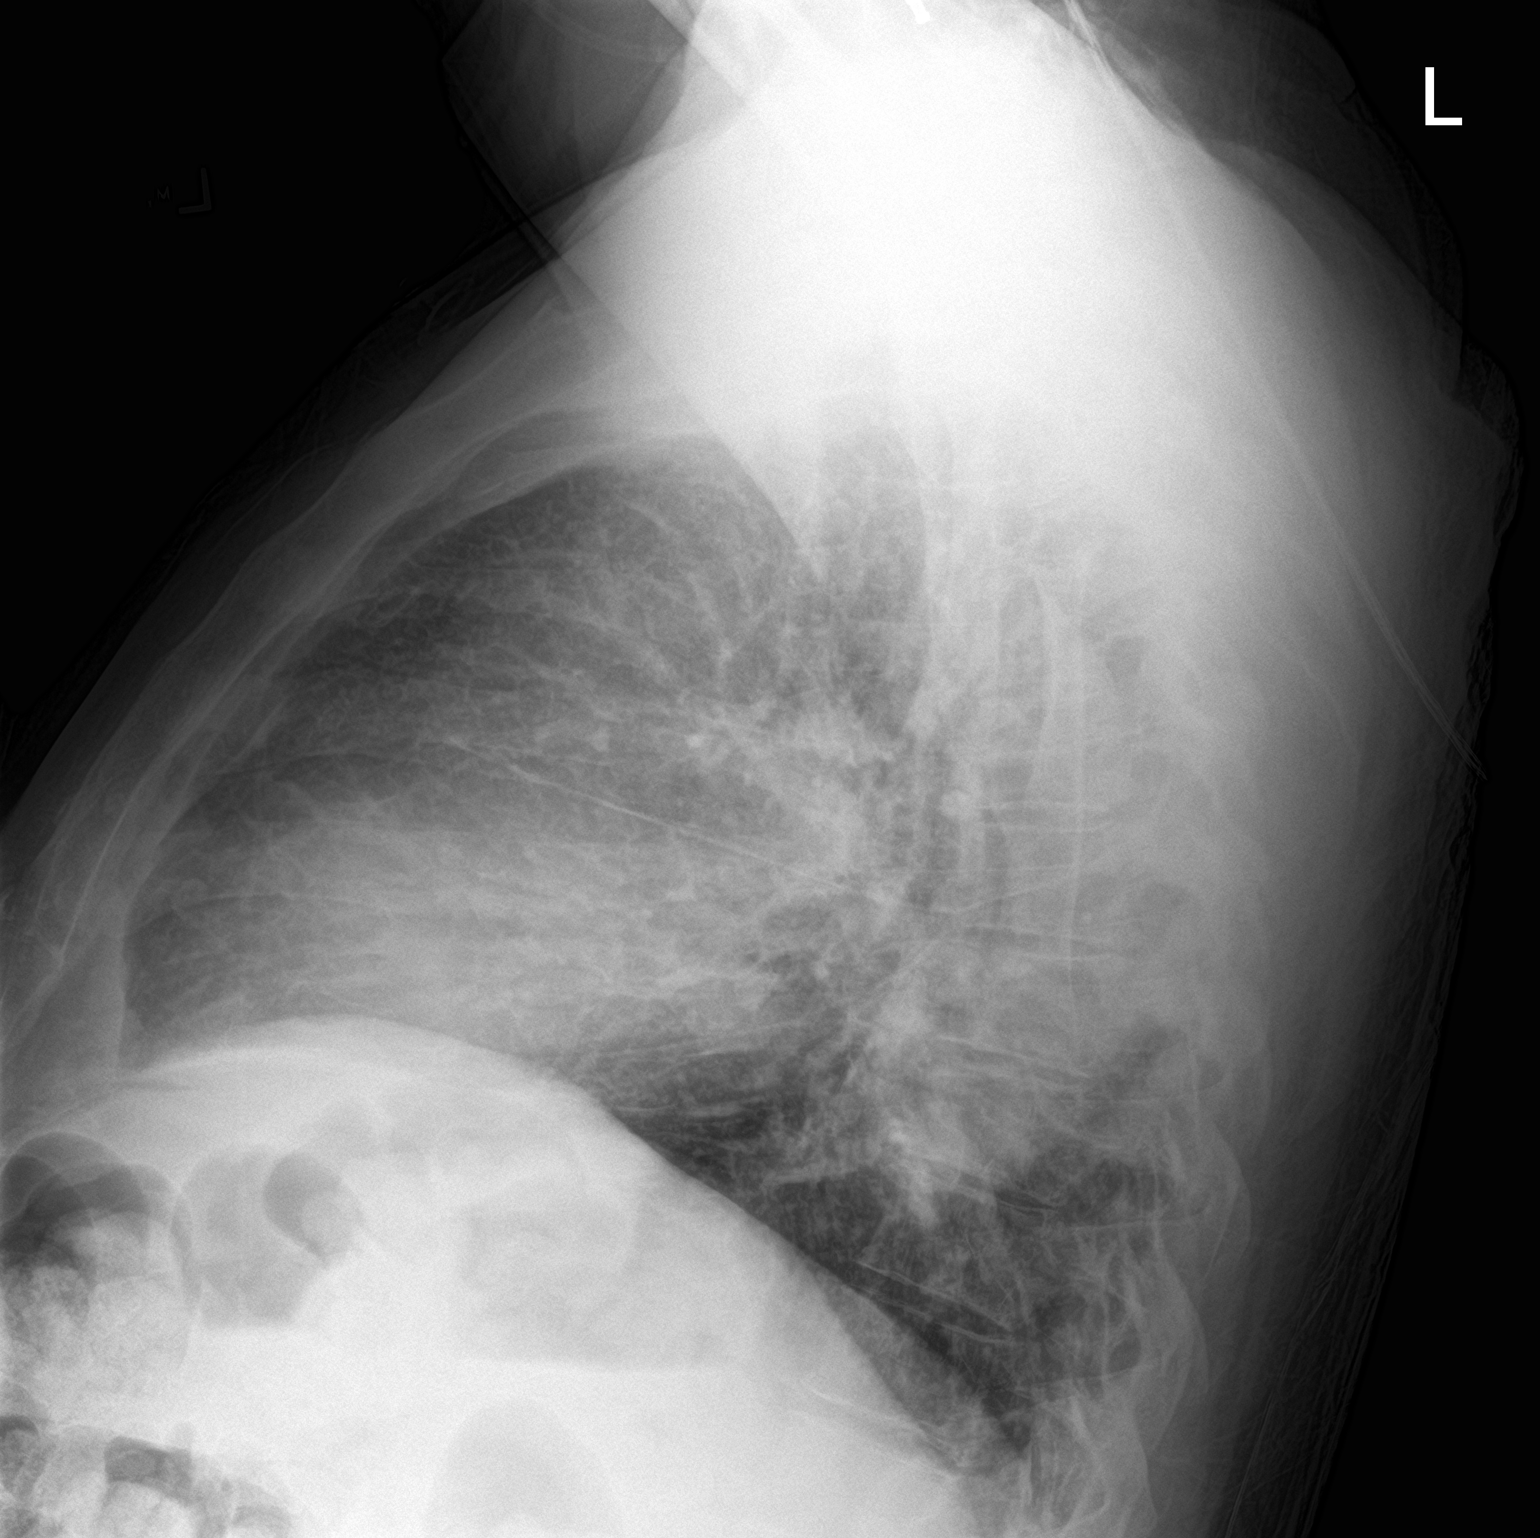

[chest ap]
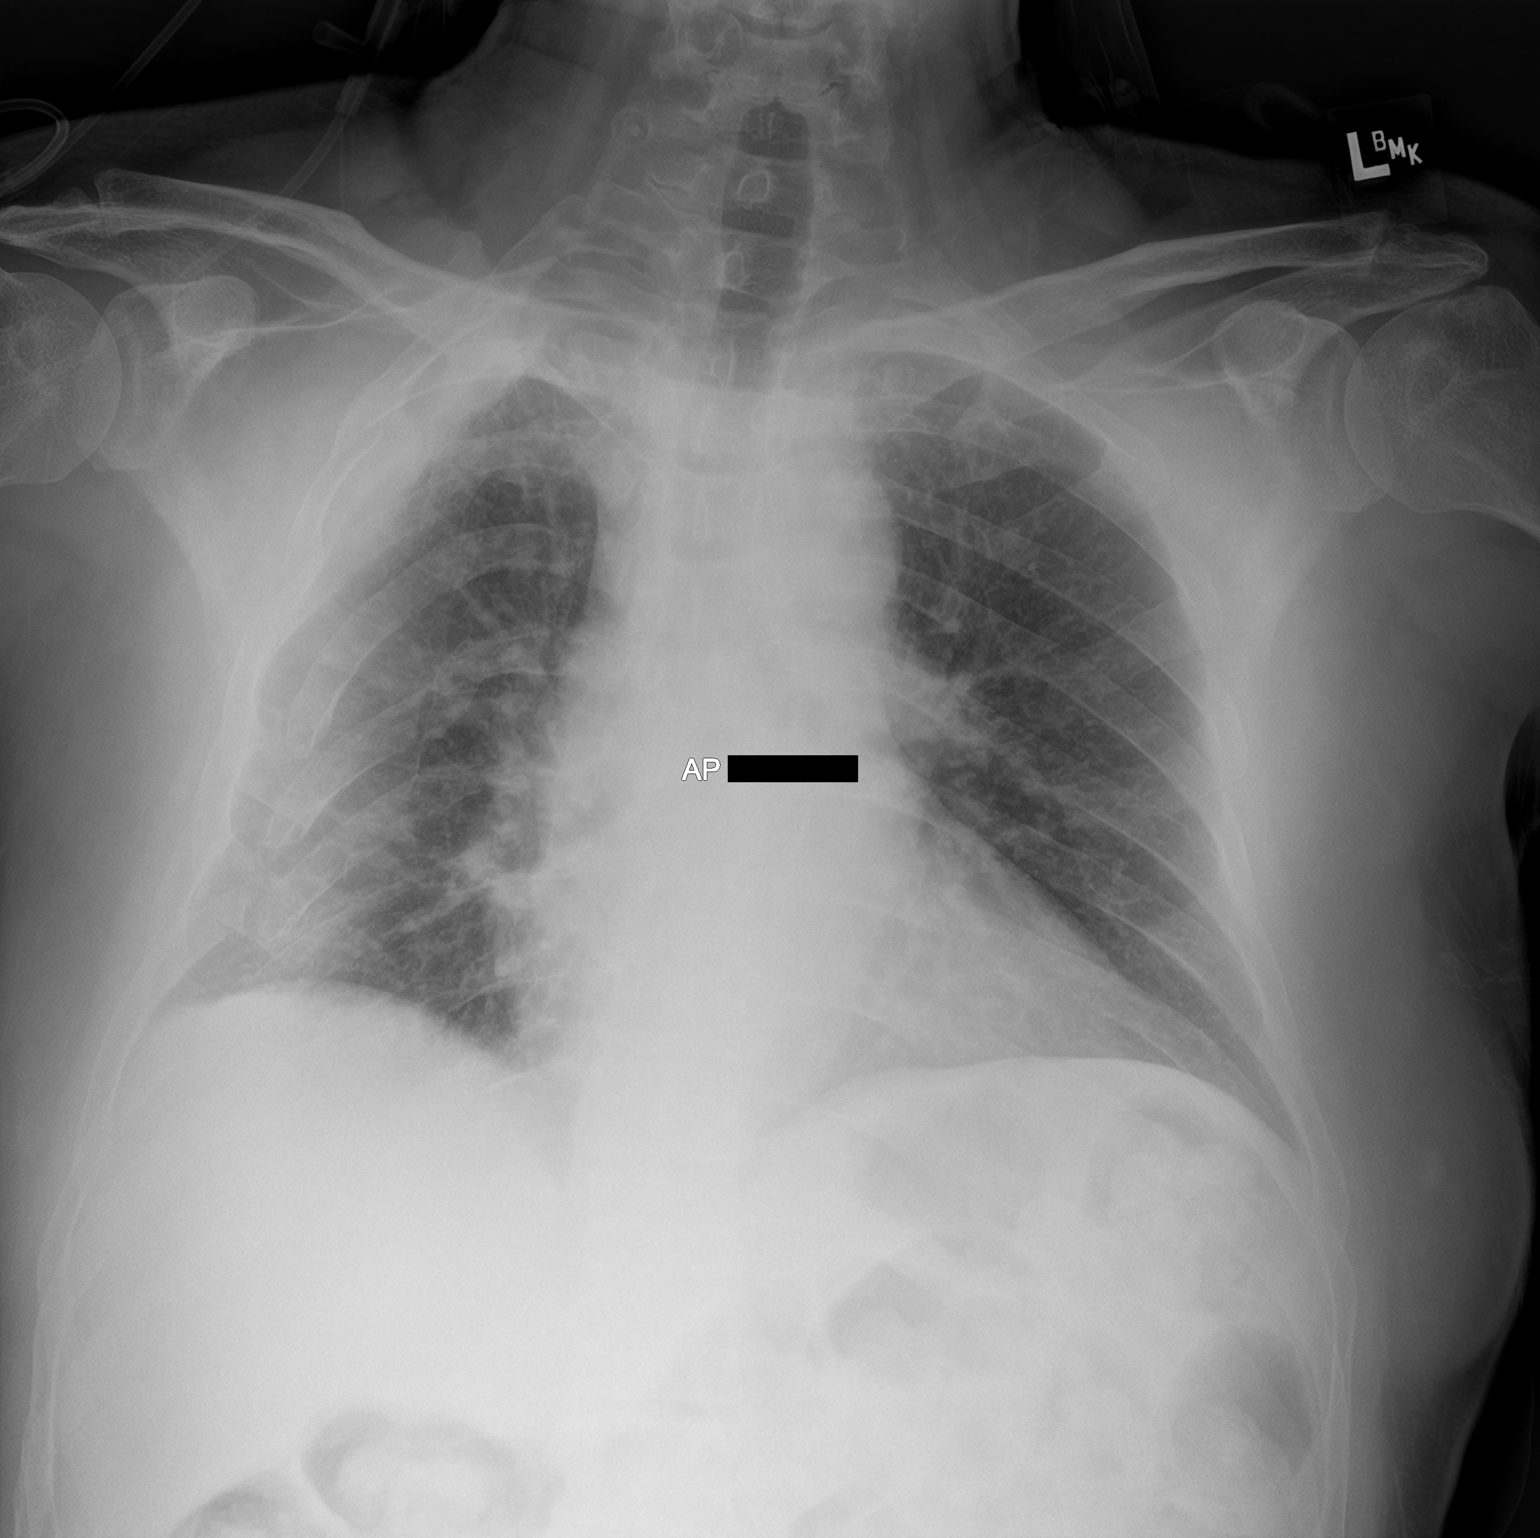

[2 of 2 positions shown; findings below may reference images not displayed]

FINDINGS: Multiple displaced right rib fractures. Right upper lobe pleural
thickening. No pneumothorax. The left lung is clear. The cardiac
silhouette is within normal limits.
IMPRESSION: Multiple displaced right rib fractures. No pneumothorax.

## 2024-02-02 ENCOUNTER — Telehealth: Payer: Self-pay | Admitting: Primary Care

## 2024-02-02 NOTE — Telephone Encounter (Signed)
 Thank you, noted.

## 2024-02-02 NOTE — Telephone Encounter (Signed)
 The patient has no-showed two scheduled home sleep study appointments and has not responded to outreach attempts. The order for the home sleep test has been closed. A notification letter was sent to the patient, and our contact information was provided should they wish to follow up. FYI

## 2024-03-24 ENCOUNTER — Ambulatory Visit: Payer: Self-pay | Admitting: Physician Assistant

## 2024-04-24 ENCOUNTER — Other Ambulatory Visit: Payer: Self-pay

## 2024-04-24 DIAGNOSIS — I7121 Aneurysm of the ascending aorta, without rupture: Secondary | ICD-10-CM

## 2024-06-05 ENCOUNTER — Ambulatory Visit: Payer: Self-pay
